# Patient Record
Sex: Female | Born: 1975 | Race: White | Hispanic: No | Marital: Single | Smoking: Never smoker
Health system: Southern US, Community
[De-identification: ages and names within clinical notes are randomized; demographics above are authoritative.]

## PROBLEM LIST (undated history)

## (undated) DIAGNOSIS — J302 Other seasonal allergic rhinitis: Secondary | ICD-10-CM

## (undated) HISTORY — DX: Other seasonal allergic rhinitis: J30.2

---
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
## 2006-07-24 ENCOUNTER — Other Ambulatory Visit: Admission: RE | Admit: 2006-07-24 | Discharge: 2006-07-24 | Payer: Self-pay | Admitting: Gynecology

## 2007-09-16 ENCOUNTER — Other Ambulatory Visit: Admission: RE | Admit: 2007-09-16 | Discharge: 2007-09-16 | Payer: Self-pay | Admitting: Gynecology

## 2007-12-26 ENCOUNTER — Inpatient Hospital Stay: Payer: Self-pay | Admitting: General Surgery

## 2007-12-30 HISTORY — PX: APPENDECTOMY: SHX54

## 2008-10-17 ENCOUNTER — Ambulatory Visit: Payer: Self-pay | Admitting: Women's Health

## 2008-10-17 ENCOUNTER — Other Ambulatory Visit: Admission: RE | Admit: 2008-10-17 | Discharge: 2008-10-17 | Payer: Self-pay | Admitting: Gynecology

## 2008-10-17 ENCOUNTER — Encounter: Payer: Self-pay | Admitting: Women's Health

## 2009-10-20 ENCOUNTER — Ambulatory Visit: Payer: Self-pay | Admitting: Women's Health

## 2009-10-20 ENCOUNTER — Other Ambulatory Visit: Admission: RE | Admit: 2009-10-20 | Discharge: 2009-10-20 | Payer: Self-pay | Admitting: Gynecology

## 2010-11-16 ENCOUNTER — Other Ambulatory Visit: Payer: Self-pay | Admitting: Women's Health

## 2010-11-16 ENCOUNTER — Encounter (INDEPENDENT_AMBULATORY_CARE_PROVIDER_SITE_OTHER): Payer: BC Managed Care – PPO | Admitting: Women's Health

## 2010-11-16 ENCOUNTER — Other Ambulatory Visit (HOSPITAL_COMMUNITY)
Admission: RE | Admit: 2010-11-16 | Discharge: 2010-11-16 | Disposition: A | Discharge status: Home / Self Care | Payer: BC Managed Care – PPO | Source: Ambulatory Visit | Attending: Gynecology | Admitting: Gynecology

## 2010-11-16 DIAGNOSIS — Z124 Encounter for screening for malignant neoplasm of cervix: Secondary | ICD-10-CM | POA: Insufficient documentation

## 2010-11-16 DIAGNOSIS — Z01419 Encounter for gynecological examination (general) (routine) without abnormal findings: Secondary | ICD-10-CM

## 2011-10-29 ENCOUNTER — Other Ambulatory Visit: Payer: Self-pay | Admitting: *Deleted

## 2011-10-29 MED ORDER — NORGESTIMATE-ETH ESTRADIOL 0.25-35 MG-MCG PO TABS: 1.0000 | ORAL_TABLET | Freq: Every day | ORAL | Status: DC

## 2011-11-14 DIAGNOSIS — J302 Other seasonal allergic rhinitis: Secondary | ICD-10-CM | POA: Insufficient documentation

## 2011-11-20 ENCOUNTER — Ambulatory Visit (INDEPENDENT_AMBULATORY_CARE_PROVIDER_SITE_OTHER): Payer: BC Managed Care – PPO | Admitting: Women's Health

## 2011-11-20 ENCOUNTER — Encounter: Payer: Self-pay | Admitting: Women's Health

## 2011-11-20 VITALS — BP 120/86 | Ht 63.5 in | Wt 201.0 lb

## 2011-11-20 DIAGNOSIS — Z833 Family history of diabetes mellitus: Secondary | ICD-10-CM

## 2011-11-20 DIAGNOSIS — N920 Excessive and frequent menstruation with regular cycle: Secondary | ICD-10-CM

## 2011-11-20 DIAGNOSIS — Z01419 Encounter for gynecological examination (general) (routine) without abnormal findings: Secondary | ICD-10-CM

## 2011-11-20 DIAGNOSIS — Z13220 Encounter for screening for lipoid disorders: Secondary | ICD-10-CM

## 2011-11-20 MED ORDER — NORETHIN ACE-ETH ESTRAD-FE 1-20 MG-MCG PO TABS: 1.0000 | ORAL_TABLET | Freq: Every day | ORAL | Status: DC

## 2011-11-20 NOTE — Progress Notes (Signed)
Sandra York 06/27/1976 [id number]    History:    The patient presents for annual exam.  Monthly 4-5 day cycle on Ortho-Cyclen without complaint. States has been under increased stress at work causing increased fatigue. Not sexually active in years. History of all normal Paps.   Past medical history, past surgical history, family history and social history were all reviewed and documented in the EPIC chart. 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   ROS:  A  ROS was performed and pertinent positives and negatives are included in the history.  Exam:  Filed Vitals:   11/20/11 0932  BP: 120/86    General appearance:  Normal Head/Neck:  Normal, without cervical or supraclavicular adenopathy. Thyroid:  Symmetrical, normal in size, without palpable masses or nodularity. Respiratory  Effort:  Normal  Auscultation:  Clear without wheezing or rhonchi Cardiovascular  Auscultation:  Regular rate, without rubs, murmurs or gallops  Edema/varicosities:  Not grossly evident Abdominal  Soft,nontender, without masses, guarding or rebound.  Liver/spleen:  No organomegaly noted  Hernia:  None appreciated  Skin  Inspection:  Grossly normal  Palpation:  Grossly normal Neurologic/psychiatric  Orientation:  Normal with appropriate conversation.  Mood/affect:  Normal  Genitourinary    Breasts: Examined lying and sitting.     Right: Without masses, retractions, discharge or axillary adenopathy.     Left: Without masses, retractions, discharge or axillary adenopathy.   Inguinal/mons:  Normal without inguinal adenopathy  External genitalia:  Normal  BUS/Urethra/Skene's glands:  Normal  Bladder:  Normal  Vagina:  Normal  Cervix:  Normal  Uterus:   normal in size, shape and contour.  Midline and mobile  Adnexa/parametria:     Rt: Without masses or tenderness.   Lt: Without masses or tenderness.  Anus and perineum: Normal  Digital rectal exam: Normal sphincter tone without palpated masses or  tenderness  Assessment/Plan:  36 y.o. SWF G0 for annual exam.    Normal GYN exam on Ortho-Cyclen for history of menorrhagia. Blood pressure today 120/86 history of normal blood pressures Obesity  Plan: Change from Ortho-Cyclen to Loestrin 1/20. Prescription, proper use, slight risk for strokes and clots reviewed. Instructed to check blood pressure always from office if he continues to be greater than 130/80 to call office. Six-month refill given on Loestrin 1/20, return to office for blood pressure check in several months. Also reviewed importance of increasing exercise, decreasing calories for weight loss and health. Condoms encouraged if becomes sexually active. SBE's, calcium rich diet, MVI daily encouraged. CBC, glucose, lipid profile, UA.   Harrington Challenger WHNP, 11:03 AM 11/20/2011

## 2011-11-21 LAB — CBC WITH DIFFERENTIAL/PLATELET
Basophils Absolute: 0.1 10*3/uL (ref 0.0–0.1)
Eosinophils Absolute: 0.2 10*3/uL (ref 0.0–0.7)
Eosinophils Relative: 3 % (ref 0–5)
Lymphocytes Relative: 37 % (ref 12–46)
Lymphs Abs: 2.2 10*3/uL (ref 0.7–4.0)
MCV: 94.6 fL (ref 78.0–100.0)
Neutrophils Relative %: 51 % (ref 43–77)
Platelets: 296 10*3/uL (ref 150–400)
RBC: 4.42 MIL/uL (ref 3.87–5.11)
RDW: 13.5 % (ref 11.5–15.5)
WBC: 5.9 10*3/uL (ref 4.0–10.5)

## 2011-11-21 LAB — URINALYSIS W MICROSCOPIC + REFLEX CULTURE
Bilirubin Urine: NEGATIVE
Crystals: NONE SEEN
Glucose, UA: NEGATIVE mg/dL
Leukocytes, UA: NEGATIVE
Specific Gravity, Urine: 1.021 (ref 1.005–1.030)
Squamous Epithelial / LPF: NONE SEEN
Urobilinogen, UA: 0.2 mg/dL (ref 0.0–1.0)

## 2011-11-21 LAB — LIPID PANEL
Cholesterol: 220 mg/dL — ABNORMAL HIGH (ref 0–200)
HDL: 82 mg/dL (ref >39)

## 2011-11-26 ENCOUNTER — Other Ambulatory Visit: Payer: Self-pay

## 2011-11-26 NOTE — Telephone Encounter (Signed)
[facility] REQUESTING REFILLS ON MONONESSA. [place] CHANGED PT. LOESTRIN. NOTIFIED CASEY TO CANCEL MONONESSA RX IN SYSTEM.

## 2012-05-14 ENCOUNTER — Ambulatory Visit (INDEPENDENT_AMBULATORY_CARE_PROVIDER_SITE_OTHER): Payer: BC Managed Care – PPO | Admitting: Women's Health

## 2012-05-14 ENCOUNTER — Encounter: Payer: Self-pay | Admitting: Women's Health

## 2012-05-14 VITALS — BP 126/80

## 2012-05-14 DIAGNOSIS — N920 Excessive and frequent menstruation with regular cycle: Secondary | ICD-10-CM

## 2012-05-14 MED ORDER — NORETHIN ACE-ETH ESTRAD-FE 1-20 MG-MCG PO TABS: 1.0000 | ORAL_TABLET | Freq: Every day | ORAL | Status: DC

## 2012-05-14 NOTE — Progress Notes (Signed)
Patient ID: Sandra York, female   DOB: 01-17-1976, 36 y.o.   MRN: [id number] Presents for blood pressure followup, states has taken her blood pressure monthly at home at different times and blood pressures remained less than 130/80. Blood pressure at annual exam was elevated 120/86, currently on Loestrin 120. Denies headache or visual changes.  Exam: Blood pressure 126/80 today.   Oral contraception pill with occasional blood pressure elevations.  Plan: Continue to monitor blood pressure, call or return to office if blood pressure greater than 130/80. Aware of hazards of hypertension. Prescription for Loestrin 1/20 with 6 refills until next annual exam given with precautions of slight risk for blood clots and strokes. Other contraception options were reviewed, declines would prefer to continue with the pill.

## 2012-06-09 ENCOUNTER — Other Ambulatory Visit: Payer: Self-pay | Admitting: *Deleted

## 2012-06-09 DIAGNOSIS — N920 Excessive and frequent menstruation with regular cycle: Secondary | ICD-10-CM

## 2012-06-09 MED ORDER — NORETHIN ACE-ETH ESTRAD-FE 1-20 MG-MCG PO TABS: 1.0000 | ORAL_TABLET | Freq: Every day | ORAL | Status: DC

## 2012-11-23 ENCOUNTER — Encounter: Payer: Self-pay | Admitting: Women's Health

## 2012-11-23 ENCOUNTER — Ambulatory Visit (INDEPENDENT_AMBULATORY_CARE_PROVIDER_SITE_OTHER): Payer: BC Managed Care – PPO | Admitting: Women's Health

## 2012-11-23 VITALS — BP 118/68 | Ht 63.5 in | Wt 196.5 lb

## 2012-11-23 DIAGNOSIS — Z833 Family history of diabetes mellitus: Secondary | ICD-10-CM

## 2012-11-23 DIAGNOSIS — N920 Excessive and frequent menstruation with regular cycle: Secondary | ICD-10-CM

## 2012-11-23 DIAGNOSIS — Z01419 Encounter for gynecological examination (general) (routine) without abnormal findings: Secondary | ICD-10-CM

## 2012-11-23 DIAGNOSIS — Z13220 Encounter for screening for lipoid disorders: Secondary | ICD-10-CM

## 2012-11-23 DIAGNOSIS — E079 Disorder of thyroid, unspecified: Secondary | ICD-10-CM

## 2012-11-23 DIAGNOSIS — G4700 Insomnia, unspecified: Secondary | ICD-10-CM

## 2012-11-23 MED ORDER — ZOLPIDEM TARTRATE 10 MG PO TABS: 10.0000 mg | ORAL_TABLET | Freq: Every evening | ORAL | Status: DC | PRN

## 2012-11-23 MED ORDER — NORETHIN ACE-ETH ESTRAD-FE 1-20 MG-MCG PO TABS: 1.0000 | ORAL_TABLET | Freq: Every day | ORAL | Status: DC

## 2012-11-23 NOTE — Progress Notes (Signed)
Sandra York [birth year] [id number]    History:    The patient presents for annual exam.  Light monthly cycle on Loestrin 1/20. Not sexually active years. History of normal Paps. Reports some increased moodiness and insomnia in the past few weeks questions if it is weather related. Difficulty sleeping for years, wakes up numerous times per night.  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   ROS:  A  ROS was performed and pertinent positives and negatives are included in the history.  Exam:  Filed Vitals:   11/23/12 0933  BP: 118/68    General appearance:  Normal Head/Neck:  Normal, without cervical or supraclavicular adenopathy. Thyroid:  Symmetrical, normal in size, without palpable masses or nodularity. Respiratory  Effort:  Normal  Auscultation:  Clear without wheezing or rhonchi Cardiovascular  Auscultation:  Regular rate, without rubs, murmurs or gallops  Edema/varicosities:  Not grossly evident Abdominal  Soft,nontender, without masses, guarding or rebound.  Liver/spleen:  No organomegaly noted  Hernia:  None appreciated  Skin  Inspection:  Grossly normal  Palpation:  Grossly normal Neurologic/psychiatric  Orientation:  Normal with appropriate conversation.  Mood/affect:  Normal  Genitourinary    Breasts: Examined lying and sitting.     Right: Without masses, retractions, discharge or axillary adenopathy.     Left: Without masses, retractions, discharge or axillary adenopathy.   Inguinal/mons:  Normal without inguinal adenopathy  External genitalia:  Normal  BUS/Urethra/Skene's glands:  Normal  Bladder:  Normal  Vagina:  Normal  Cervix:  Normal  Uterus:   normal in size, shape and contour.  Midline and mobile  Adnexa/parametria:     Rt: Without masses or tenderness.   Lt: Without masses or tenderness.  Anus and  perineum: Normal  Digital rectal exam: Normal sphincter tone without palpated masses or tenderness  Assessment/Plan:  37 y.o. S. WF G0 for annual exam.     Normal GYN exam on Loestrin 1/20 Insomnia  Plan: Denies need for counseling. Reviewed importance of leisure activities, regular exercise to help with stress and rest. Sleep hygiene reviewed. Ambien 10 mg by mouth at bedtime as needed for rest, prescription, proper use given and reviewed. SBE's, regular exercise, calcium rich diet, MVI daily encouraged. CBC, Glucose, LP, TSH, UA, Pap normal 2013, new screening guidelines reviewed.    Harrington Challenger WHNP, 10:07 AM 11/23/2012

## 2012-11-23 NOTE — Patient Instructions (Addendum)

## 2012-11-24 LAB — CBC WITH DIFFERENTIAL/PLATELET
Basophils Relative: 1 % (ref 0–1)
Eosinophils Absolute: 0.1 10*3/uL (ref 0.0–0.7)
Eosinophils Relative: 2 % (ref 0–5)
Hemoglobin: 14.8 g/dL (ref 12.0–15.0)
Lymphs Abs: 2.3 10*3/uL (ref 0.7–4.0)
MCH: 31.6 pg (ref 26.0–34.0)
MCHC: 33.9 g/dL (ref 30.0–36.0)
MCV: 93.2 fL (ref 78.0–100.0)
Monocytes Relative: 6 % (ref 3–12)
Neutrophils Relative %: 58 % (ref 43–77)

## 2012-11-24 LAB — LIPID PANEL
LDL Cholesterol: 85 mg/dL (ref 0–99)
Triglycerides: 115 mg/dL (ref <150)
VLDL: 23 mg/dL (ref 0–40)

## 2012-11-24 LAB — URINALYSIS W MICROSCOPIC + REFLEX CULTURE
Bacteria, UA: NONE SEEN
Casts: NONE SEEN
Glucose, UA: NEGATIVE mg/dL
Hgb urine dipstick: NEGATIVE
Nitrite: NEGATIVE
Specific Gravity, Urine: >1.03 — ABNORMAL HIGH (ref 1.005–1.030)
pH: 5.5 (ref 5.0–8.0)

## 2012-11-24 LAB — TSH: TSH: 0.89 u[IU]/mL (ref 0.350–4.500)

## 2012-11-24 LAB — GLUCOSE, RANDOM: Glucose, Bld: 94 mg/dL (ref 70–99)

## 2012-12-30 ENCOUNTER — Telehealth: Payer: Self-pay | Admitting: *Deleted

## 2012-12-30 ENCOUNTER — Other Ambulatory Visit: Payer: Self-pay | Admitting: Women's Health

## 2012-12-30 DIAGNOSIS — N946 Dysmenorrhea, unspecified: Secondary | ICD-10-CM

## 2012-12-30 MED ORDER — NORGESTIMATE-ETH ESTRADIOL 0.25-35 MG-MCG PO TABS: 1.0000 | ORAL_TABLET | Freq: Every day | ORAL | Status: DC

## 2012-12-30 NOTE — Telephone Encounter (Signed)
Pt is currently taking Loestrin 1/20 has been for about 64yr and half now. Pt said that she has been bleeding for about 3 weeks now and having cramping. Pt said that she noticed a difference back in dec 2013 with her body and the pills. She takes them daily on time. Please advise

## 2012-12-30 NOTE — Telephone Encounter (Signed)
Telephone call to review problem, states has had increased cramping, cycles starting third week of pills not lasting greater than 7 days. Had been on Ortho-Cyclen prior. Is a nonsmoker. Will use change back to Ortho-Cyclen, call if continued problems. Condoms if become sexually active, nonactive in years.

## 2013-08-10 ENCOUNTER — Encounter: Payer: Self-pay | Admitting: Women's Health

## 2013-09-09 ENCOUNTER — Telehealth: Payer: Self-pay | Admitting: *Deleted

## 2013-09-09 NOTE — Telephone Encounter (Signed)
Pt asked if you would call her regarding her cycle and update from telephone encounter 12/30/12. Call back # [phone]

## 2013-09-09 NOTE — Telephone Encounter (Signed)
Telephone call, states is doing well on Loestrin but states cycle is very light flow with thicker consistency. Reviewed normal, we'll continue to watch

## 2013-10-13 ENCOUNTER — Other Ambulatory Visit: Payer: Self-pay

## 2013-10-13 ENCOUNTER — Other Ambulatory Visit: Payer: Self-pay | Admitting: Women's Health

## 2013-10-13 DIAGNOSIS — N946 Dysmenorrhea, unspecified: Secondary | ICD-10-CM

## 2013-10-13 MED ORDER — NORGESTIMATE-ETH ESTRADIOL 0.25-35 MG-MCG PO TABS: 1.0000 | ORAL_TABLET | Freq: Every day | ORAL | Status: DC

## 2013-11-30 ENCOUNTER — Encounter: Payer: Self-pay | Admitting: Women's Health

## 2013-12-08 ENCOUNTER — Ambulatory Visit (INDEPENDENT_AMBULATORY_CARE_PROVIDER_SITE_OTHER): Payer: BC Managed Care – PPO | Admitting: Women's Health

## 2013-12-08 ENCOUNTER — Other Ambulatory Visit (HOSPITAL_COMMUNITY)
Admission: RE | Admit: 2013-12-08 | Discharge: 2013-12-08 | Disposition: A | Discharge status: Home / Self Care | Payer: BC Managed Care – PPO | Source: Ambulatory Visit | Attending: Gynecology | Admitting: Gynecology

## 2013-12-08 ENCOUNTER — Encounter: Payer: Self-pay | Admitting: Women's Health

## 2013-12-08 VITALS — BP 116/70 | Ht 63.75 in | Wt 183.8 lb

## 2013-12-08 DIAGNOSIS — Z833 Family history of diabetes mellitus: Secondary | ICD-10-CM

## 2013-12-08 DIAGNOSIS — N946 Dysmenorrhea, unspecified: Secondary | ICD-10-CM

## 2013-12-08 DIAGNOSIS — Z13220 Encounter for screening for lipoid disorders: Secondary | ICD-10-CM

## 2013-12-08 DIAGNOSIS — E079 Disorder of thyroid, unspecified: Secondary | ICD-10-CM

## 2013-12-08 DIAGNOSIS — Z01419 Encounter for gynecological examination (general) (routine) without abnormal findings: Secondary | ICD-10-CM

## 2013-12-08 LAB — LIPID PANEL
Cholesterol: 236 mg/dL — ABNORMAL HIGH (ref 0–200)
HDL: 85 mg/dL (ref >39)
LDL CALC: 133 mg/dL — AB (ref 0–99)
Total CHOL/HDL Ratio: 2.8 Ratio
Triglycerides: 89 mg/dL (ref <150)
VLDL: 18 mg/dL (ref 0–40)

## 2013-12-08 LAB — CBC WITH DIFFERENTIAL/PLATELET
BASOS ABS: 0.1 10*3/uL (ref 0.0–0.1)
Basophils Relative: 2 % — ABNORMAL HIGH (ref 0–1)
EOS ABS: 0.2 10*3/uL (ref 0.0–0.7)
EOS PCT: 4 % (ref 0–5)
HCT: 39.4 % (ref 36.0–46.0)
Hemoglobin: 13.2 g/dL (ref 12.0–15.0)
Lymphocytes Relative: 36 % (ref 12–46)
Lymphs Abs: 1.9 10*3/uL (ref 0.7–4.0)
MCH: 31 pg (ref 26.0–34.0)
MCHC: 33.5 g/dL (ref 30.0–36.0)
MCV: 92.5 fL (ref 78.0–100.0)
Monocytes Absolute: 0.3 10*3/uL (ref 0.1–1.0)
Monocytes Relative: 5 % (ref 3–12)
Neutro Abs: 2.8 10*3/uL (ref 1.7–7.7)
Neutrophils Relative %: 53 % (ref 43–77)
PLATELETS: 307 10*3/uL (ref 150–400)
RBC: 4.26 MIL/uL (ref 3.87–5.11)
RDW: 13.8 % (ref 11.5–15.5)
WBC: 5.2 10*3/uL (ref 4.0–10.5)

## 2013-12-08 LAB — GLUCOSE, RANDOM: Glucose, Bld: 104 mg/dL — ABNORMAL HIGH (ref 70–99)

## 2013-12-08 LAB — TSH: TSH: 0.79 u[IU]/mL (ref 0.350–4.500)

## 2013-12-08 MED ORDER — NORGESTIMATE-ETH ESTRADIOL 0.25-35 MG-MCG PO TABS: 1.0000 | ORAL_TABLET | Freq: Every day | ORAL | Status: DC

## 2013-12-08 NOTE — Patient Instructions (Signed)

## 2013-12-08 NOTE — Addendum Note (Signed)
Addended by: Richardson ChiquitoWILKINSON, KARI S on: 12/08/2013 10:36 AM   Modules accepted: Orders

## 2013-12-08 NOTE — Progress Notes (Signed)
Sandra [name] 05/20/1976 [id number]    History:    Presents for annual exam. Monthly cycle on Ortho-Cyclen. Not sexually active years. Had spotting and increased cramps on Loestrin. Normal Pap history. Has lost 16 pounds in the past year with diet and exercise.  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 Father hypertension.  ROS:  A  ROS was performed and pertinent positives and negatives are included.  Exam:  Filed Vitals:   12/08/13 0935  BP: 116/70    General appearance:  Normal Thyroid:  Symmetrical, normal in size, without palpable masses or nodularity. Respiratory  Auscultation:  Clear without wheezing or rhonchi Cardiovascular  Auscultation:  Regular rate, without rubs, murmurs or gallops  Edema/varicosities:  Not grossly evident Abdominal  Soft,nontender, without masses, guarding or rebound.  Liver/spleen:  No organomegaly noted  Hernia:  None appreciated  Skin  Inspection:  Grossly normal   Breasts: Examined lying and sitting.     Right: Without masses, retractions, discharge or axillary adenopathy.     Left: Without masses, retractions, discharge or axillary adenopathy. Gentitourinary   Inguinal/mons:  Normal without inguinal adenopathy  External genitalia:  Normal  BUS/Urethra/Skene's glands:  Normal  Vagina:  Normal  Cervix:  Normal  Uterus:   normal in size, shape and contour.  Midline and mobile  Adnexa/parametria:     Rt: Without masses or tenderness.   Lt: Without masses or tenderness.  Anus and perineum: Normal  Digital rectal exam: Normal sphincter tone without palpated masses or tenderness  Assessment/Plan:  38 y.o. SWF G0 for annual exam with no complaints.  Normal GYN exam on OCs for cycle control  Plan: Ortho-Cyclen prescription, proper use, reviewed risks of blood clots and strokes. Condoms  encouraged if become sexually active. SBE's, screening mammogram an annual screen age 38. Continue regular exercise/walking daily, decreasing calories for continued weight loss, calcium rich foods, MVI daily. CBC, glucose, lipid panel, TSH, UA, Pap. Pap Normal 10/2010, new screening guidelines reviewed.    Harrington ChallengerYOUNG,NANCY J WHNP, 10:01 AM 12/08/2013

## 2013-12-09 ENCOUNTER — Encounter: Payer: Self-pay | Admitting: Women's Health

## 2013-12-09 LAB — URINALYSIS W MICROSCOPIC + REFLEX CULTURE
Bilirubin Urine: NEGATIVE
CASTS: NONE SEEN
CRYSTALS: NONE SEEN
Glucose, UA: NEGATIVE mg/dL
HGB URINE DIPSTICK: NEGATIVE
Ketones, ur: NEGATIVE mg/dL
LEUKOCYTES UA: NEGATIVE
NITRITE: NEGATIVE
PH: 7 (ref 5.0–8.0)
Protein, ur: NEGATIVE mg/dL
SPECIFIC GRAVITY, URINE: 1.011 (ref 1.005–1.030)
Squamous Epithelial / LPF: NONE SEEN
UROBILINOGEN UA: 0.2 mg/dL (ref 0.0–1.0)

## 2013-12-10 ENCOUNTER — Other Ambulatory Visit: Payer: Self-pay | Admitting: Women's Health

## 2013-12-10 MED ORDER — CIPROFLOXACIN HCL 250 MG PO TABS: 250.0000 mg | ORAL_TABLET | Freq: Two times a day (BID) | ORAL | Status: DC

## 2013-12-11 LAB — URINE CULTURE: Colony Count: >=100000

## 2014-07-15 ENCOUNTER — Other Ambulatory Visit: Payer: Self-pay

## 2014-12-21 ENCOUNTER — Ambulatory Visit (INDEPENDENT_AMBULATORY_CARE_PROVIDER_SITE_OTHER): Payer: BC Managed Care – PPO | Admitting: Women's Health

## 2014-12-21 ENCOUNTER — Encounter: Payer: Self-pay | Admitting: Women's Health

## 2014-12-21 VITALS — BP 112/72 | Ht 63.75 in | Wt 186.0 lb

## 2014-12-21 DIAGNOSIS — F515 Nightmare disorder: Secondary | ICD-10-CM | POA: Diagnosis not present

## 2014-12-21 DIAGNOSIS — Z01419 Encounter for gynecological examination (general) (routine) without abnormal findings: Secondary | ICD-10-CM | POA: Diagnosis not present

## 2014-12-21 DIAGNOSIS — Z13220 Encounter for screening for lipoid disorders: Secondary | ICD-10-CM | POA: Diagnosis not present

## 2014-12-21 DIAGNOSIS — Z3041 Encounter for surveillance of contraceptive pills: Secondary | ICD-10-CM

## 2014-12-21 DIAGNOSIS — Z833 Family history of diabetes mellitus: Secondary | ICD-10-CM

## 2014-12-21 DIAGNOSIS — Z113 Encounter for screening for infections with a predominantly sexual mode of transmission: Secondary | ICD-10-CM

## 2014-12-21 DIAGNOSIS — K640 First degree hemorrhoids: Secondary | ICD-10-CM

## 2014-12-21 LAB — CBC WITH DIFFERENTIAL/PLATELET
Basophils Absolute: 0.1 10*3/uL (ref 0.0–0.1)
Basophils Relative: 2 % — ABNORMAL HIGH (ref 0–1)
EOS PCT: 3 % (ref 0–5)
Eosinophils Absolute: 0.2 10*3/uL (ref 0.0–0.7)
HEMATOCRIT: 39.1 % (ref 36.0–46.0)
HEMOGLOBIN: 12.8 g/dL (ref 12.0–15.0)
LYMPHS ABS: 1.6 10*3/uL (ref 0.7–4.0)
LYMPHS PCT: 27 % (ref 12–46)
MCH: 30.3 pg (ref 26.0–34.0)
MCHC: 32.7 g/dL (ref 30.0–36.0)
MCV: 92.7 fL (ref 78.0–100.0)
MPV: 9.4 fL (ref 8.6–12.4)
Monocytes Absolute: 0.5 10*3/uL (ref 0.1–1.0)
Monocytes Relative: 9 % (ref 3–12)
Neutro Abs: 3.5 10*3/uL (ref 1.7–7.7)
Neutrophils Relative %: 59 % (ref 43–77)
Platelets: 299 10*3/uL (ref 150–400)
RBC: 4.22 MIL/uL (ref 3.87–5.11)
RDW: 13.3 % (ref 11.5–15.5)
WBC: 6 10*3/uL (ref 4.0–10.5)

## 2014-12-21 LAB — LIPID PANEL
CHOL/HDL RATIO: 2.1 ratio
Cholesterol: 205 mg/dL — ABNORMAL HIGH (ref 0–200)
HDL: 96 mg/dL (ref >=46)
LDL Cholesterol: 81 mg/dL (ref 0–99)
TRIGLYCERIDES: 138 mg/dL (ref <150)
VLDL: 28 mg/dL (ref 0–40)

## 2014-12-21 LAB — GLUCOSE, RANDOM: Glucose, Bld: 102 mg/dL — ABNORMAL HIGH (ref 70–99)

## 2014-12-21 MED ORDER — NORETHIN ACE-ETH ESTRAD-FE 1-20 MG-MCG PO TABS: 1.0000 | ORAL_TABLET | Freq: Every day | ORAL | Status: DC

## 2014-12-21 MED ORDER — HYDROCORTISONE ACE-PRAMOXINE 2.5-1 % RE CREA: 1.0000 "application " | TOPICAL_CREAM | Freq: Three times a day (TID) | RECTAL | Status: DC

## 2014-12-21 NOTE — Patient Instructions (Signed)
About Hemorrhoids  Hemorrhoids are swollen veins in the lower rectum and anus.  Also called piles, hemorrhoids are a common problem.  Hemorrhoids may be internal (inside the rectum) or external (around the anus).  Internal Hemorrhoids  Internal hemorrhoids are often painless, but they rarely cause bleeding.  The internal veins may stretch and fall down (prolapse) through the anus to the outside of the body.  The veins may then become irritated and painful.  External Hemorrhoids  External hemorrhoids can be easily seen or felt around the anal opening.  They are under the skin around the anus.  When the swollen veins are scratched or broken by straining, rubbing or wiping they sometimes bleed.  How Hemorrhoids Occur  Veins in the rectum and around the anus tend to swell under pressure.  Hemorrhoids can result from increased pressure in the veins of your anus or rectum.  Some sources of pressure are:   Straining to have a bowel movement because of constipation  Waiting too long to have a bowel movement  Coughing and sneezing often  Sitting for extended periods of time, including on the toilet  Diarrhea  Obesity  Trauma or injury to the anus  Some liver diseases  Stress  Family history of hemorrhoids  Pregnancy  Pregnant women should try to avoid becoming constipated, because they are more likely to have hemorrhoids during pregnancy.  In the last trimester of pregnancy, the enlarged uterus may press on blood vessels and causes hemorrhoids.  In addition, the strain of childbirth sometimes causes hemorrhoids after the birth.  Symptoms of Hemorrhoids  Some symptoms of hemorrhoids include:  Swelling and/or a tender lump around the anus  Itching, mild burning and bleeding around the anus  Painful bowel movements with or without constipation  Bright red blood covering the stool, on toilet paper or in the toilet bowel.   Symptoms usually go away within a few days.  Always  talk to your doctor about any bleeding to make sure it is not from some other causes.  Diagnosing and Treating Hemorrhoids  Diagnosis is made by an examination by your healthcare provider.  Special test can be performed by your doctor.    Most cases of hemorrhoids can be treated with:  High-fiber diet: Eat more high-fiber foods, which help prevent constipation.  Ask for more detailed fiber information on types and sources of fiber from your healthcare provider.  Fluids: Drink plenty of water.  This helps soften bowel movements so they are easier to pass.  Sitz baths and cold packs: Sitting in lukewarm water two or three times a day for 15 minutes cleases the anal area and may relieve discomfort.  If the water is too hot, swelling around the anus will get worse.  Placing a cloth-covered ice pack on the anus for ten minutes four times a day can also help reduce selling.  Gently pushing a prolapsed hemorrhoid back inside after the bath or ice pack can be helpful.  Medications: For mild discomfort, your healthcare provider may suggest over-the-counter pain medication or prescribe a cream or ointment for topical use.  The cream may contain witch hazel, zinc oxide or petroleum jelly.  Medicated suppositories are also a treatment option.  Always consult your doctor before applying medications or creams.  Procedures and surgeries: There are also a number of procedures and surgeries to shrink or remove hemorrhoids in more serious cases.  Talk to your physician about these options.  You can often prevent hemorrhoids or keep  them from becoming worse by maintaining a healthy lifestyle.  Eat a fiber-rich diet of fruits, vegetables and whole grains.  Also, drink plenty of water and exercise regularly.   2007, [employer] Doc.30Insomnia Insomnia is frequent trouble falling and/or staying asleep. Insomnia can be a long term problem or a short term problem. Both are common. Insomnia can be a short  term problem when the wakefulness is related to a certain stress or worry. Long term insomnia is often related to ongoing stress during waking hours and/or poor sleeping habits. Overtime, sleep deprivation itself can make the problem worse. Every little thing feels more severe because you are overtired and your ability to cope is decreased. CAUSES  12. Stress, anxiety, and depression. 13. Poor sleeping habits. 14. Distractions such as TV in the bedroom. 15. Naps close to bedtime. 74. Engaging in emotionally charged conversations before bed. 17. Technical reading before sleep. 18. Alcohol and other sedatives. They may make the problem worse. They can hurt normal sleep patterns and normal dream activity. 19. Stimulants such as caffeine for several hours prior to bedtime. 20. Pain syndromes and shortness of breath can cause insomnia. 21. Exercise late at night. 22. Changing time zones may cause sleeping problems (jet lag). It is sometimes helpful to have someone observe your sleeping patterns. They should look for periods of not breathing during the night (sleep apnea). They should also look to see how long those periods last. If you live alone or observers are uncertain, you can also be observed at a sleep clinic where your sleep patterns will be professionally monitored. Sleep apnea requires a checkup and treatment. Give your caregivers your medical history. Give your caregivers observations your family has made about your sleep.  SYMPTOMS  5. Not feeling rested in the morning. 6. Anxiety and restlessness at bedtime. 7. Difficulty falling and staying asleep. TREATMENT  6. Your caregiver may prescribe treatment for an underlying medical disorders. Your caregiver can give advice or help if you are using alcohol or other drugs for self-medication. Treatment of underlying problems will usually eliminate insomnia problems. 7. Medications can be prescribed for short time use. They are generally not  recommended for lengthy use. 8. Over-the-counter sleep medicines are not recommended for lengthy use. They can be habit forming. 9. You can promote easier sleeping by making lifestyle changes such as: 1. Using relaxation techniques that help with breathing and reduce muscle tension. 2. Exercising earlier in the day. 3. Changing your diet and the time of your last meal. No night time snacks. 4. Establish a regular time to go to bed. 10. Counseling can help with stressful problems and worry. 11. Soothing music and white noise may be helpful if there are background noises you cannot remove. 12. Stop tedious detailed work at least one hour before bedtime. HOME CARE INSTRUCTIONS   Keep a diary. Inform your caregiver about your progress. This includes any medication side effects. See your caregiver regularly. Take note of:  Times when you are asleep.  Times when you are awake during the night.  The quality of your sleep.  How you feel the next day. This information will help your caregiver care for you.  Get out of bed if you are still awake after 15 minutes. Read or do some quiet activity. Keep the lights down. Wait until you feel sleepy and go back to bed.  Keep regular sleeping and waking hours. Avoid naps.  Exercise regularly.  Avoid distractions at bedtime. Distractions include watching television  or engaging in any intense or detailed activity like attempting to balance the household checkbook.  Develop a bedtime ritual. Keep a familiar routine of bathing, brushing your teeth, climbing into bed at the same time each night, listening to soothing music. Routines increase the success of falling to sleep faster.  Use relaxation techniques. This can be using breathing and muscle tension release routines. It can also include visualizing peaceful scenes. You can also help control troubling or intruding thoughts by keeping your mind occupied with boring or repetitive thoughts like the old  concept of counting sheep. You can make it more creative like imagining planting one beautiful flower after another in your backyard garden.  During your day, work to eliminate stress. When this is not possible use some of the previous suggestions to help reduce the anxiety that accompanies stressful situations. MAKE SURE YOU:   Understand these instructions.  Will watch your condition.  Will get help right away if you are not doing well or get worse. Document Released: 09/13/2000 Document Revised: 12/09/2011 Document Reviewed: 10/14/2007 [facility] Patient Information 2015 [place], [place]. This information is not intended to replace advice given to you by your health care provider. Make sure you discuss any questions you have with your health care provider. Health Maintenance Adopting a healthy lifestyle and getting preventive care can go a long way to promote health and wellness. Talk with your health care provider about what schedule of regular examinations is right for you. This is a good chance for you to check in with your provider about disease prevention and staying healthy. In between checkups, there are plenty of things you can do on your own. Experts have done a lot of research about which lifestyle changes and preventive measures are most likely to keep you healthy. Ask your health care provider for more information. WEIGHT AND DIET  Eat a healthy diet 23. Be sure to include plenty of vegetables, fruits, low-fat dairy products, and lean protein. 24. Do not eat a lot of foods high in solid fats, added sugars, or salt. 25. Get regular exercise. This is one of the most important things you can do for your health. 1. Most adults should exercise for at least 150 minutes each week. The exercise should increase your heart rate and make you sweat (moderate-intensity exercise). 2. Most adults should also do strengthening exercises at least twice a week. This is in addition to the  moderate-intensity exercise.  Maintain a healthy weight 8. Body mass index (BMI) is a measurement that can be used to identify possible weight problems. It estimates body fat based on height and weight. Your health care provider can help determine your BMI and help you achieve or maintain a healthy weight. 2. For females 59 years of age and older:  1. A BMI below 18.5 is considered underweight. 2. A BMI of 18.5 to 24.9 is normal. 3. A BMI of 25 to 29.9 is considered overweight. 4. A BMI of 30 and above is considered obese.  Watch levels of cholesterol and blood lipids 13. You should start having your blood tested for lipids and cholesterol at 41 years of age, then have this test every 5 years. 14. You may need to have your cholesterol levels checked more often if: 1. Your lipid or cholesterol levels are high. 2. You are older than 41 years of age. 3. You are at high risk for heart disease.  CANCER SCREENING   Lung Cancer  Lung cancer screening is recommended for  adults 32-30 years old who are at high risk for lung cancer because of a history of smoking.  A yearly low-dose CT scan of the lungs is recommended for people who:  Currently smoke.  Have quit within the past 15 years.  Have at least a 30-pack-year history of smoking. A pack year is smoking an average of one pack of cigarettes a day for 1 year.  Yearly screening should continue until it has been 15 years since you quit.  Yearly screening should stop if you develop a health problem that would prevent you from having lung cancer treatment.  Breast Cancer  Practice breast self-awareness. This means understanding how your breasts normally appear and feel.  It also means doing regular breast self-exams. Let your health care provider know about any changes, no matter how small.  If you are in your 20s or 30s, you should have a clinical breast exam (CBE) by a health care provider every 1-3 years as part of a regular health  exam.  If you are 47 or older, have a CBE every year. Also consider having a breast X-ray (mammogram) every year.  If you have a family history of breast cancer, talk to your health care provider about genetic screening.  If you are at high risk for breast cancer, talk to your health care provider about having an MRI and a mammogram every year.  Breast cancer gene (BRCA) assessment is recommended for women who have family members with BRCA-related cancers. BRCA-related cancers include:  Breast.  Ovarian.  Tubal.  Peritoneal cancers.  Results of the assessment will determine the need for genetic counseling and BRCA1 and BRCA2 testing. Cervical Cancer Routine pelvic examinations to screen for cervical cancer are no longer recommended for nonpregnant women who are considered low risk for cancer of the pelvic organs (ovaries, uterus, and vagina) and who do not have symptoms. A pelvic examination may be necessary if you have symptoms including those associated with pelvic infections. Ask your health care provider if a screening pelvic exam is right for you.   The Pap test is the screening test for cervical cancer for women who are considered at risk.  If you had a hysterectomy for a problem that was not cancer or a condition that could lead to cancer, then you no longer need Pap tests.  If you are older than 65 years, and you have had normal Pap tests for the past 10 years, you no longer need to have Pap tests.  If you have had past treatment for cervical cancer or a condition that could lead to cancer, you need Pap tests and screening for cancer for at least 20 years after your treatment.  If you no longer get a Pap test, assess your risk factors if they change (such as having a new sexual partner). This can affect whether you should start being screened again.  Some women have medical problems that increase their chance of getting cervical cancer. If this is the case for you, your health  care provider may recommend more frequent screening and Pap tests.  The human papillomavirus (HPV) test is another test that may be used for cervical cancer screening. The HPV test looks for the virus that can cause cell changes in the cervix. The cells collected during the Pap test can be tested for HPV.  The HPV test can be used to screen women 43 years of age and older. Getting tested for HPV can extend the interval between normal Pap  tests from three to five years.  An HPV test also should be used to screen women of any age who have unclear Pap test results.  After 41 years of age, women should have HPV testing as often as Pap tests.  Colorectal Cancer  This type of cancer can be detected and often prevented.  Routine colorectal cancer screening usually begins at 41 years of age and continues through 40 years of age.  Your health care provider may recommend screening at an earlier age if you have risk factors for colon cancer.  Your health care provider may also recommend using home test kits to check for hidden blood in the stool.  A small camera at the end of a tube can be used to examine your colon directly (sigmoidoscopy or colonoscopy). This is done to check for the earliest forms of colorectal cancer.  Routine screening usually begins at age 86.  Direct examination of the colon should be repeated every 5-10 years through 41 years of age. However, you may need to be screened more often if early forms of precancerous polyps or small growths are found. Skin Cancer  Check your skin from head to toe regularly.  Tell your health care provider about any new moles or changes in moles, especially if there is a change in a mole's shape or color.  Also tell your health care provider if you have a mole that is larger than the size of a pencil eraser.  Always use sunscreen. Apply sunscreen liberally and repeatedly throughout the day.  Protect yourself by wearing long sleeves, pants, a  wide-brimmed hat, and sunglasses whenever you are outside. HEART DISEASE, DIABETES, AND HIGH BLOOD PRESSURE   Have your blood pressure checked at least every 1-2 years. High blood pressure causes heart disease and increases the risk of stroke.  If you are between 66 years and 54 years old, ask your health care provider if you should take aspirin to prevent strokes.  Have regular diabetes screenings. This involves taking a blood sample to check your fasting blood sugar level.  If you are at a normal weight and have a low risk for diabetes, have this test once every three years after 41 years of age.  If you are overweight and have a high risk for diabetes, consider being tested at a younger age or more often. PREVENTING INFECTION  Hepatitis B  If you have a higher risk for hepatitis B, you should be screened for this virus. You are considered at high risk for hepatitis B if:  You were born in a country where hepatitis B is common. Ask your health care provider which countries are considered high risk.  Your parents were born in a high-risk country, and you have not been immunized against hepatitis B (hepatitis B vaccine).  You have HIV or AIDS.  You use needles to inject street drugs.  You live with someone who has hepatitis B.  You have had sex with someone who has hepatitis B.  You get hemodialysis treatment.  You take certain medicines for conditions, including cancer, organ transplantation, and autoimmune conditions. Hepatitis C  Blood testing is recommended for:  Everyone born from [birth year] through 1967.  Anyone with known risk factors for hepatitis C. Sexually transmitted infections (STIs)  You should be screened for sexually transmitted infections (STIs) including gonorrhea and chlamydia if:  You are sexually active and are younger than 41 years of age.  You are older than 41 years of age and your  health care provider tells you that you are at risk for this type of  infection.  Your sexual activity has changed since you were last screened and you are at an increased risk for chlamydia or gonorrhea. Ask your health care provider if you are at risk.  If you do not have HIV, but are at risk, it may be recommended that you take a prescription medicine daily to prevent HIV infection. This is called pre-exposure prophylaxis (PrEP). You are considered at risk if:  You are sexually active and do not regularly use condoms or know the HIV status of your partner(s).  You take drugs by injection.  You are sexually active with a partner who has HIV. Talk with your health care provider about whether you are at high risk of being infected with HIV. If you choose to begin PrEP, you should first be tested for HIV. You should then be tested every 3 months for as long as you are taking PrEP.  PREGNANCY   If you are premenopausal and you may become pregnant, ask your health care provider about preconception counseling.  If you may become pregnant, take 400 to 800 micrograms (mcg) of folic acid every day.  If you want to prevent pregnancy, talk to your health care provider about birth control (contraception). OSTEOPOROSIS AND MENOPAUSE   Osteoporosis is a disease in which the bones lose minerals and strength with aging. This can result in serious bone fractures. Your risk for osteoporosis can be identified using a bone density scan.  If you are 40 years of age or older, or if you are at risk for osteoporosis and fractures, ask your health care provider if you should be screened.  Ask your health care provider whether you should take a calcium or vitamin D supplement to lower your risk for osteoporosis.  Menopause may have certain physical symptoms and risks.  Hormone replacement therapy may reduce some of these symptoms and risks. Talk to your health care provider about whether hormone replacement therapy is right for you.  HOME CARE INSTRUCTIONS   Schedule regular  health, dental, and eye exams.  Stay current with your immunizations.   Do not use any tobacco products including cigarettes, chewing tobacco, or electronic cigarettes.  If you are pregnant, do not drink alcohol.  If you are breastfeeding, limit how much and how often you drink alcohol.  Limit alcohol intake to no more than 1 drink per day for nonpregnant women. One drink equals 12 ounces of beer, 5 ounces of wine, or 1 ounces of hard liquor.  Do not use street drugs.  Do not share needles.  Ask your health care provider for help if you need support or information about quitting drugs.  Tell your health care provider if you often feel depressed.  Tell your health care provider if you have ever been abused or do not feel safe at home. Document Released: 04/01/2011 Document Revised: 01/31/2014 Document Reviewed: 08/18/2013 [facility] Patient Information 2015 [place], [place]. This information is not intended to replace advice given to you by your health care provider. Make sure you discuss any questions you have with your health care provider.

## 2014-12-21 NOTE — Addendum Note (Signed)
Addended by: Kem ParkinsonBARNES, Kalianne Fetting on: 12/21/2014 03:46 PM   Modules accepted: Orders

## 2014-12-21 NOTE — Addendum Note (Signed)
Addended by: Kem ParkinsonBARNES, Valjean Ruppel on: 12/21/2014 03:54 PM   Modules accepted: Orders

## 2014-12-21 NOTE — Progress Notes (Signed)
Sandra York 08/19/1976 [id number]    History:    Presents for annual exam.  Monthly cycle on Ortho-Cyclen. Not sexually active. Had tried Loestrin in the past had spotting. Continues to have problems with bad dreams for years, is able to sleep. Normal Pap history. Has not had a mammogram.  Past medical history, past surgical history, family history and social history were all reviewed and documented in the EPIC chart. 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  ROS:  A ROS was performed and pertinent positives and negatives are included.  Exam:  Filed Vitals:   12/21/14 1013  BP: 112/72    General appearance:  Normal Thyroid:  Symmetrical, normal in size, without palpable masses or nodularity. Respiratory  Auscultation:  Clear without wheezing or rhonchi Cardiovascular  Auscultation:  Regular rate, without rubs, murmurs or gallops  Edema/varicosities:  Not grossly evident Abdominal  Soft,nontender, without masses, guarding or rebound.  Liver/spleen:  No organomegaly noted  Hernia:  None appreciated  Skin  Inspection:  Grossly normal   Breasts: Examined lying and sitting.     Right: Without masses, retractions, discharge or axillary adenopathy.     Left: Without masses, retractions, discharge or axillary adenopathy. Gentitourinary   Inguinal/mons:  Normal without inguinal adenopathy  External genitalia:  Normal  BUS/Urethra/Skene's glands:  Normal  Vagina:  Normal  Cervix:  Normal  Uterus: normal in size, shape and contour.  Midline and mobile  Adnexa/parametria:     Rt: Without masses or tenderness.   Lt: Without masses or tenderness.  Anus and perineum: 1 cm nonthrombosed hemorrhoid  Digital rectal exam: Normal sphincter tone without palpated masses or tenderness  Assessment/Plan:  39 y.o. SWF G0 for annual exam, reports noting scant bright red blood with bowel movements 2.  Hemorrhoid Monthly cycle on Ortho-Cyclen Poor rest/bad dreams.  Plan: Loestrin  prescription, proper use, slight risk for blood clots and strokes, call if problems with change. Condoms encouraged if sexually active. GC/Chlamydia, declines HIV, hepatitis or RPR. SBE's, annual screening mammogram, center information given, instructed to schedule. CBC, lipid panel, glucose, UA, Pap normal 2015, new screening guidelines reviewed. Discussed imagery/relaxation prior to sleep, continue light yoga in the evenings, avoid TV prior to bed. Analpram-HC at bedtime for several days. Call if continued noting blood with stools, ways to prevent constipation discussed, reviewed if rectal bleeding persists, changes or increases will refer for screening colonoscopy.    Sandra York [facility]enterWHNP, 12:17 PM 12/21/2014

## 2014-12-23 ENCOUNTER — Other Ambulatory Visit: Payer: Self-pay

## 2014-12-23 DIAGNOSIS — Z1239 Encounter for other screening for malignant neoplasm of breast: Secondary | ICD-10-CM

## 2015-01-09 ENCOUNTER — Telehealth: Payer: Self-pay | Admitting: *Deleted

## 2015-01-09 NOTE — Telephone Encounter (Signed)
Pt called stating someone from the office called stating she needed to have a repeat urine. I didn't see anything in chart, labs, office visit at all about this. Patient was seen on 12/21/14 for annual and no urine was done. I told pt to disregard message since no note in system.

## 2015-01-11 ENCOUNTER — Ambulatory Visit
Admission: RE | Admit: 2015-01-11 | Discharge: 2015-01-11 | Disposition: A | Discharge status: Home / Self Care | Payer: BC Managed Care – PPO | Source: Ambulatory Visit

## 2015-01-11 ENCOUNTER — Encounter: Payer: Self-pay | Admitting: Women's Health

## 2015-01-11 ENCOUNTER — Other Ambulatory Visit: Payer: Self-pay

## 2015-01-11 DIAGNOSIS — Z1231 Encounter for screening mammogram for malignant neoplasm of breast: Secondary | ICD-10-CM

## 2015-03-31 ENCOUNTER — Other Ambulatory Visit: Payer: Self-pay | Admitting: Women's Health

## 2015-12-15 ENCOUNTER — Other Ambulatory Visit: Payer: Self-pay

## 2015-12-15 DIAGNOSIS — Z1231 Encounter for screening mammogram for malignant neoplasm of breast: Secondary | ICD-10-CM

## 2015-12-22 ENCOUNTER — Encounter: Payer: Self-pay | Admitting: Women's Health

## 2015-12-22 ENCOUNTER — Ambulatory Visit (INDEPENDENT_AMBULATORY_CARE_PROVIDER_SITE_OTHER): Payer: BC Managed Care – PPO | Admitting: Women's Health

## 2015-12-22 VITALS — BP 132/80 | Ht 63.0 in | Wt 196.0 lb

## 2015-12-22 DIAGNOSIS — R5383 Other fatigue: Secondary | ICD-10-CM

## 2015-12-22 DIAGNOSIS — Z13220 Encounter for screening for lipoid disorders: Secondary | ICD-10-CM

## 2015-12-22 DIAGNOSIS — Z3041 Encounter for surveillance of contraceptive pills: Secondary | ICD-10-CM

## 2015-12-22 DIAGNOSIS — Z01419 Encounter for gynecological examination (general) (routine) without abnormal findings: Secondary | ICD-10-CM

## 2015-12-22 LAB — COMPREHENSIVE METABOLIC PANEL
ALT: 9 U/L (ref 6–29)
AST: 10 U/L (ref 10–30)
Albumin: 3.7 g/dL (ref 3.6–5.1)
Alkaline Phosphatase: 45 U/L (ref 33–115)
BUN: 13 mg/dL (ref 7–25)
CHLORIDE: 104 mmol/L (ref 98–110)
CO2: 23 mmol/L (ref 20–31)
Calcium: 9.2 mg/dL (ref 8.6–10.2)
Creat: 1.03 mg/dL (ref 0.50–1.10)
Glucose, Bld: 106 mg/dL — ABNORMAL HIGH (ref 65–99)
POTASSIUM: 3.6 mmol/L (ref 3.5–5.3)
Sodium: 138 mmol/L (ref 135–146)
TOTAL PROTEIN: 6.8 g/dL (ref 6.1–8.1)
Total Bilirubin: 0.3 mg/dL (ref 0.2–1.2)

## 2015-12-22 LAB — CBC WITH DIFFERENTIAL/PLATELET
BASOS PCT: 1 % (ref 0–1)
Basophils Absolute: 0.1 10*3/uL (ref 0.0–0.1)
Eosinophils Absolute: 0.2 10*3/uL (ref 0.0–0.7)
Eosinophils Relative: 3 % (ref 0–5)
HCT: 41.3 % (ref 36.0–46.0)
Hemoglobin: 13.5 g/dL (ref 12.0–15.0)
LYMPHS ABS: 1.6 10*3/uL (ref 0.7–4.0)
Lymphocytes Relative: 27 % (ref 12–46)
MCH: 30.6 pg (ref 26.0–34.0)
MCHC: 32.7 g/dL (ref 30.0–36.0)
MCV: 93.7 fL (ref 78.0–100.0)
MONO ABS: 0.4 10*3/uL (ref 0.1–1.0)
MONOS PCT: 6 % (ref 3–12)
MPV: 10.2 fL (ref 8.6–12.4)
Neutro Abs: 3.7 10*3/uL (ref 1.7–7.7)
Neutrophils Relative %: 63 % (ref 43–77)
Platelets: 298 10*3/uL (ref 150–400)
RBC: 4.41 MIL/uL (ref 3.87–5.11)
RDW: 13 % (ref 11.5–15.5)
WBC: 5.9 10*3/uL (ref 4.0–10.5)

## 2015-12-22 LAB — TSH: TSH: 0.67 mIU/L

## 2015-12-22 LAB — LIPID PANEL
CHOL/HDL RATIO: 2.7 ratio (ref <=5.0)
CHOLESTEROL: 200 mg/dL (ref 125–200)
HDL: 75 mg/dL (ref >=46)
LDL Cholesterol: 113 mg/dL (ref <130)
TRIGLYCERIDES: 60 mg/dL (ref <150)
VLDL: 12 mg/dL (ref <30)

## 2015-12-22 MED ORDER — NORETHIN ACE-ETH ESTRAD-FE 1-20 MG-MCG PO TABS: 1.0000 | ORAL_TABLET | Freq: Every day | ORAL | Status: DC

## 2015-12-22 NOTE — Patient Instructions (Signed)
Health Maintenance, Female Adopting a healthy lifestyle and getting preventive care can go a long way to promote health and wellness. Talk with your health care provider about what schedule of regular examinations is right for you. This is a good chance for you to check in with your provider about disease prevention and staying healthy. In between checkups, there are plenty of things you can do on your own. Experts have done a lot of research about which lifestyle changes and preventive measures are most likely to keep you healthy. Ask your health care provider for more information. WEIGHT AND DIET  Eat a healthy diet  Be sure to include plenty of vegetables, fruits, low-fat dairy products, and lean protein.  Do not eat a lot of foods high in solid fats, added sugars, or salt.  Get regular exercise. This is one of the most important things you can do for your health.  Most adults should exercise for at least 150 minutes each week. The exercise should increase your heart rate and make you sweat (moderate-intensity exercise).  Most adults should also do strengthening exercises at least twice a week. This is in addition to the moderate-intensity exercise.  Maintain a healthy weight  Body mass index (BMI) is a measurement that can be used to identify possible weight problems. It estimates body fat based on height and weight. Your health care provider can help determine your BMI and help you achieve or maintain a healthy weight.  For females 20 years of age and older:   A BMI below 18.5 is considered underweight.  A BMI of 18.5 to 24.9 is normal.  A BMI of 25 to 29.9 is considered overweight.  A BMI of 30 and above is considered obese.  Watch levels of cholesterol and blood lipids  You should start having your blood tested for lipids and cholesterol at 42 years of age, then have this test every 5 years.  You may need to have your cholesterol levels checked more often if:  Your lipid  or cholesterol levels are high.  You are older than 42 years of age.  You are at high risk for heart disease.  CANCER SCREENING   Lung Cancer  Lung cancer screening is recommended for adults 55-80 years old who are at high risk for lung cancer because of a history of smoking.  A yearly low-dose CT scan of the lungs is recommended for people who:  Currently smoke.  Have quit within the past 15 years.  Have at least a 30-pack-year history of smoking. A pack year is smoking an average of one pack of cigarettes a day for 1 year.  Yearly screening should continue until it has been 15 years since you quit.  Yearly screening should stop if you develop a health problem that would prevent you from having lung cancer treatment.  Breast Cancer  Practice breast self-awareness. This means understanding how your breasts normally appear and feel.  It also means doing regular breast self-exams. Let your health care provider know about any changes, no matter how small.  If you are in your 20s or 30s, you should have a clinical breast exam (CBE) by a health care provider every 1-3 years as part of a regular health exam.  If you are 40 or older, have a CBE every year. Also consider having a breast X-ray (mammogram) every year.  If you have a family history of breast cancer, talk to your health care provider about genetic screening.  If you   are at high risk for breast cancer, talk to your health care provider about having an MRI and a mammogram every year.  Breast cancer gene (BRCA) assessment is recommended for women who have family members with BRCA-related cancers. BRCA-related cancers include:  Breast.  Ovarian.  Tubal.  Peritoneal cancers.  Results of the assessment will determine the need for genetic counseling and BRCA1 and BRCA2 testing. Cervical Cancer Your health care provider may recommend that you be screened regularly for cancer of the pelvic organs (ovaries, uterus, and  vagina). This screening involves a pelvic examination, including checking for microscopic changes to the surface of your cervix (Pap test). You may be encouraged to have this screening done every 3 years, beginning at age 21.  For women ages 30-65, health care providers may recommend pelvic exams and Pap testing every 3 years, or they may recommend the Pap and pelvic exam, combined with testing for human papilloma virus (HPV), every 5 years. Some types of HPV increase your risk of cervical cancer. Testing for HPV may also be done on women of any age with unclear Pap test results.  Other health care providers may not recommend any screening for nonpregnant women who are considered low risk for pelvic cancer and who do not have symptoms. Ask your health care provider if a screening pelvic exam is right for you.  If you have had past treatment for cervical cancer or a condition that could lead to cancer, you need Pap tests and screening for cancer for at least 20 years after your treatment. If Pap tests have been discontinued, your risk factors (such as having a new sexual partner) need to be reassessed to determine if screening should resume. Some women have medical problems that increase the chance of getting cervical cancer. In these cases, your health care provider may recommend more frequent screening and Pap tests. Colorectal Cancer  This type of cancer can be detected and often prevented.  Routine colorectal cancer screening usually begins at 42 years of age and continues through 42 years of age.  Your health care provider may recommend screening at an earlier age if you have risk factors for colon cancer.  Your health care provider may also recommend using home test kits to check for hidden blood in the stool.  A small camera at the end of a tube can be used to examine your colon directly (sigmoidoscopy or colonoscopy). This is done to check for the earliest forms of colorectal  cancer.  Routine screening usually begins at age 50.  Direct examination of the colon should be repeated every 5-10 years through 42 years of age. However, you may need to be screened more often if early forms of precancerous polyps or small growths are found. Skin Cancer  Check your skin from head to toe regularly.  Tell your health care provider about any new moles or changes in moles, especially if there is a change in a mole's shape or color.  Also tell your health care provider if you have a mole that is larger than the size of a pencil eraser.  Always use sunscreen. Apply sunscreen liberally and repeatedly throughout the day.  Protect yourself by wearing long sleeves, pants, a wide-brimmed hat, and sunglasses whenever you are outside. HEART DISEASE, DIABETES, AND HIGH BLOOD PRESSURE   High blood pressure causes heart disease and increases the risk of stroke. High blood pressure is more likely to develop in:  People who have blood pressure in the high end   of the normal range (130-139/85-89 mm Hg).  People who are overweight or obese.  People who are African American.  If you are 38-23 years of age, have your blood pressure checked every 3-5 years. If you are 61 years of age or older, have your blood pressure checked every year. You should have your blood pressure measured twice--once when you are at a hospital or clinic, and once when you are not at a hospital or clinic. Record the average of the two measurements. To check your blood pressure when you are not at a hospital or clinic, you can use:  An automated blood pressure machine at a pharmacy.  A home blood pressure monitor.  If you are between 45 years and 39 years old, ask your health care provider if you should take aspirin to prevent strokes.  Have regular diabetes screenings. This involves taking a blood sample to check your fasting blood sugar level.  If you are at a normal weight and have a low risk for diabetes,  have this test once every three years after 42 years of age.  If you are overweight and have a high risk for diabetes, consider being tested at a younger age or more often. PREVENTING INFECTION  Hepatitis B  If you have a higher risk for hepatitis B, you should be screened for this virus. You are considered at high risk for hepatitis B if:  You were born in a country where hepatitis B is common. Ask your health care provider which countries are considered high risk.  Your parents were born in a high-risk country, and you have not been immunized against hepatitis B (hepatitis B vaccine).  You have HIV or AIDS.  You use needles to inject street drugs.  You live with someone who has hepatitis B.  You have had sex with someone who has hepatitis B.  You get hemodialysis treatment.  You take certain medicines for conditions, including cancer, organ transplantation, and autoimmune conditions. Hepatitis C  Blood testing is recommended for:  Everyone born from [birth year] through 1967.  Anyone with known risk factors for hepatitis C. Sexually transmitted infections (STIs)  You should be screened for sexually transmitted infections (STIs) including gonorrhea and chlamydia if:  You are sexually active and are younger than 42 years of age.  You are older than 42 years of age and your health care provider tells you that you are at risk for this type of infection.  Your sexual activity has changed since you were last screened and you are at an increased risk for chlamydia or gonorrhea. Ask your health care provider if you are at risk.  If you do not have HIV, but are at risk, it may be recommended that you take a prescription medicine daily to prevent HIV infection. This is called pre-exposure prophylaxis (PrEP). You are considered at risk if:  You are sexually active and do not regularly use condoms or know the HIV status of your partner(s).  You take drugs by injection.  You are sexually  active with a partner who has HIV. Talk with your health care provider about whether you are at high risk of being infected with HIV. If you choose to begin PrEP, you should first be tested for HIV. You should then be tested every 3 months for as long as you are taking PrEP.  PREGNANCY   If you are premenopausal and you may become pregnant, ask your health care provider about preconception counseling.  If you may  become pregnant, take 400 to 800 micrograms (mcg) of folic acid every day.  If you want to prevent pregnancy, talk to your health care provider about birth control (contraception). OSTEOPOROSIS AND MENOPAUSE   Osteoporosis is a disease in which the bones lose minerals and strength with aging. This can result in serious bone fractures. Your risk for osteoporosis can be identified using a bone density scan.  If you are 61 years of age or older, or if you are at risk for osteoporosis and fractures, ask your health care provider if you should be screened.  Ask your health care provider whether you should take a calcium or vitamin D supplement to lower your risk for osteoporosis.  Menopause may have certain physical symptoms and risks.  Hormone replacement therapy may reduce some of these symptoms and risks. Talk to your health care provider about whether hormone replacement therapy is right for you.  HOME CARE INSTRUCTIONS   Schedule regular health, dental, and eye exams.  Stay current with your immunizations.   Do not use any tobacco products including cigarettes, chewing tobacco, or electronic cigarettes.  If you are pregnant, do not drink alcohol.  If you are breastfeeding, limit how much and how often you drink alcohol.  Limit alcohol intake to no more than 1 drink per day for nonpregnant women. One drink equals 12 ounces of beer, 5 ounces of wine, or 1 ounces of hard liquor.  Do not use street drugs.  Do not share needles.  Ask your health care provider for help if  you need support or information about quitting drugs.  Tell your health care provider if you often feel depressed.  Tell your health care provider if you have ever been abused or do not feel safe at home.   This information is not intended to replace advice given to you by your health care provider. Make sure you discuss any questions you have with your health care provider.   Document Released: 04/01/2011 Document Revised: 10/07/2014 Document Reviewed: 08/18/2013 Elsevier Interactive Patient Education [employer].

## 2015-12-22 NOTE — Progress Notes (Signed)
Tykisha A Rountree 12/07/1975 [id number]    History:    Presents for annual exam.  Monthly cycle on Loestrin. Not sexually active years. Normal Pap and mammogram history.   Past medical history, past surgical history, family history and social history were all reviewed and documented in the EPIC chart. 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 Going to [place] this summer. Father hypertension.  ROS:  A ROS was performed and pertinent positives and negatives are included.  Exam:  Filed Vitals:   12/22/15 0944  BP: 132/80    General appearance:  Normal Thyroid:  Symmetrical, normal in size, without palpable masses or nodularity. Respiratory  Auscultation:  Clear without wheezing or rhonchi Cardiovascular  Auscultation:  Regular rate, without rubs, murmurs or gallops  Edema/varicosities:  Not grossly evident Abdominal  Soft,nontender, without masses, guarding or rebound.  Liver/spleen:  No organomegaly noted  Hernia:  None appreciated  Skin  Inspection:  Grossly normal   Breasts: Examined lying and sitting.     Right: Without masses, retractions, discharge or axillary adenopathy.     Left: Without masses, retractions, discharge or axillary adenopathy. Gentitourinary   Inguinal/mons:  Normal without inguinal adenopathy  External genitalia:  Normal  BUS/Urethra/Skene's glands:  Normal  Vagina:  Normal  Cervix:  Normal  Uterus:  normal in size, shape and contour.  Midline and mobile  Adnexa/parametria:     Rt: Without masses or tenderness.   Lt: Without masses or tenderness.  Anus and perineum: Normal  Digital rectal exam: Normal sphincter tone without palpated masses or tenderness  Assessment/Plan:  40 y.o. S WF G0 for annual exam no complaints other than fatigue at times.  Light monthly cycle on Loestrin Overweight  Plan: Loestrin 1/20 prescription, proper use given and reviewed slight risk for blood clots and strokes. SBEs, continue annual 3-D screening  mammogram history of dense breasts. Encouraged to increase regular exercise and decrease calories for weight loss (10 poundwt gain past year). CBC, TSH, lipid panel, UA, Pap normal 2015, new screening guidelines reviewed.    Harrington ChallengerYOUNG,NANCY J [facility] Wayne HospitalWHNP, 10:39 AM 12/22/2015

## 2015-12-23 LAB — URINALYSIS W MICROSCOPIC + REFLEX CULTURE
Bilirubin Urine: NEGATIVE
Casts: NONE SEEN [LPF]
Crystals: NONE SEEN [HPF]
Glucose, UA: NEGATIVE
KETONES UR: NEGATIVE
Leukocytes, UA: NEGATIVE
Nitrite: POSITIVE — AB
PROTEIN: NEGATIVE
RBC / HPF: NONE SEEN RBC/HPF (ref <=2)
Specific Gravity, Urine: 1.016 (ref 1.001–1.035)
Yeast: NONE SEEN [HPF]
pH: 6.5 (ref 5.0–8.0)

## 2015-12-25 ENCOUNTER — Telehealth: Payer: Self-pay | Admitting: Women's Health

## 2015-12-25 LAB — URINE CULTURE: Colony Count: >=100000

## 2015-12-25 NOTE — Telephone Encounter (Signed)
Telephone call, reviewed blood sugar slightly elevated, it was not a fasting blood sugar, reviewed importance of increasing regular exercise, avoiding simple sugars. No type 2 diabetes in family history. Check hemoglobin A1c next year.

## 2015-12-25 NOTE — Telephone Encounter (Signed)
-----   Message from Keenan BachelorKatherine R Annas, [place]MA sent at 12/25/2015 11:59 AM EDT ----- Bryn GullingNancy, Nicole said patient will have to return for HgbA1c. Cannot do it with blood that was drawn. Do you want her to return?

## 2015-12-26 ENCOUNTER — Telehealth: Payer: Self-pay | Admitting: Women's Health

## 2015-12-26 NOTE — Telephone Encounter (Signed)
.  tc

## 2015-12-27 ENCOUNTER — Other Ambulatory Visit: Payer: Self-pay | Admitting: Women's Health

## 2015-12-27 ENCOUNTER — Encounter: Payer: BC Managed Care – PPO | Admitting: Women's Health

## 2015-12-27 DIAGNOSIS — N3001 Acute cystitis with hematuria: Secondary | ICD-10-CM

## 2015-12-27 MED ORDER — SULFAMETHOXAZOLE-TRIMETHOPRIM 800-160 MG PO TABS: 1.0000 | ORAL_TABLET | Freq: Two times a day (BID) | ORAL | Status: DC

## 2015-12-27 NOTE — Progress Notes (Signed)
Spoke with patient. Informed her. Rx sent.

## 2016-01-23 ENCOUNTER — Ambulatory Visit
Admission: RE | Admit: 2016-01-23 | Discharge: 2016-01-23 | Disposition: A | Discharge status: Home / Self Care | Payer: BC Managed Care – PPO | Source: Ambulatory Visit

## 2016-01-23 ENCOUNTER — Encounter: Payer: Self-pay | Admitting: Women's Health

## 2016-01-23 DIAGNOSIS — Z1231 Encounter for screening mammogram for malignant neoplasm of breast: Secondary | ICD-10-CM

## 2016-04-14 ENCOUNTER — Other Ambulatory Visit: Payer: Self-pay | Admitting: Women's Health

## 2016-05-31 ENCOUNTER — Encounter: Payer: Self-pay | Admitting: Women's Health

## 2016-12-19 ENCOUNTER — Other Ambulatory Visit: Payer: Self-pay | Admitting: Women's Health

## 2016-12-19 DIAGNOSIS — Z1231 Encounter for screening mammogram for malignant neoplasm of breast: Secondary | ICD-10-CM

## 2016-12-27 ENCOUNTER — Encounter: Payer: BC Managed Care – PPO | Admitting: Women's Health

## 2016-12-30 ENCOUNTER — Other Ambulatory Visit: Payer: Self-pay | Admitting: Women's Health

## 2016-12-30 DIAGNOSIS — Z3041 Encounter for surveillance of contraceptive pills: Secondary | ICD-10-CM

## 2017-01-13 ENCOUNTER — Ambulatory Visit (INDEPENDENT_AMBULATORY_CARE_PROVIDER_SITE_OTHER): Payer: BC Managed Care – PPO | Admitting: Women's Health

## 2017-01-13 ENCOUNTER — Encounter: Payer: Self-pay | Admitting: Women's Health

## 2017-01-13 VITALS — BP 132/80 | Ht 63.0 in | Wt 199.0 lb

## 2017-01-13 DIAGNOSIS — Z1151 Encounter for screening for human papillomavirus (HPV): Secondary | ICD-10-CM

## 2017-01-13 DIAGNOSIS — Z01419 Encounter for gynecological examination (general) (routine) without abnormal findings: Secondary | ICD-10-CM | POA: Diagnosis not present

## 2017-01-13 DIAGNOSIS — Z13220 Encounter for screening for lipoid disorders: Secondary | ICD-10-CM

## 2017-01-13 DIAGNOSIS — Z8639 Personal history of other endocrine, nutritional and metabolic disease: Secondary | ICD-10-CM | POA: Diagnosis not present

## 2017-01-13 DIAGNOSIS — Z3041 Encounter for surveillance of contraceptive pills: Secondary | ICD-10-CM | POA: Diagnosis not present

## 2017-01-13 LAB — CBC WITH DIFFERENTIAL/PLATELET
BASOS ABS: 104 {cells}/uL (ref 0–200)
Basophils Relative: 2 %
EOS PCT: 2 %
Eosinophils Absolute: 104 cells/uL (ref 15–500)
HCT: 41.5 % (ref 35.0–45.0)
Hemoglobin: 13.4 g/dL (ref 11.7–15.5)
Lymphocytes Relative: 31 %
Lymphs Abs: 1612 cells/uL (ref 850–3900)
MCH: 30.7 pg (ref 27.0–33.0)
MCHC: 32.3 g/dL (ref 32.0–36.0)
MCV: 95.2 fL (ref 80.0–100.0)
MONOS PCT: 7 %
MPV: 9.5 fL (ref 7.5–12.5)
Monocytes Absolute: 364 cells/uL (ref 200–950)
NEUTROS ABS: 3016 {cells}/uL (ref 1500–7800)
Neutrophils Relative %: 58 %
PLATELETS: 338 10*3/uL (ref 140–400)
RBC: 4.36 MIL/uL (ref 3.80–5.10)
RDW: 13.1 % (ref 11.0–15.0)
WBC: 5.2 10*3/uL (ref 3.8–10.8)

## 2017-01-13 LAB — LIPID PANEL
CHOL/HDL RATIO: 2.9 ratio (ref <5.0)
CHOLESTEROL: 216 mg/dL — AB (ref <200)
HDL: 75 mg/dL (ref >50)
LDL Cholesterol: 115 mg/dL — ABNORMAL HIGH (ref <100)
Triglycerides: 128 mg/dL (ref <150)
VLDL: 26 mg/dL (ref <30)

## 2017-01-13 MED ORDER — NORETHIN ACE-ETH ESTRAD-FE 1-20 MG-MCG PO TABS: 1.0000 | ORAL_TABLET | Freq: Every day | ORAL | 4 refills | Status: DC

## 2017-01-13 MED ORDER — NORETHIN ACE-ETH ESTRAD-FE 1-20 MG-MCG PO TABS: 1.0000 | ORAL_TABLET | Freq: Every day | ORAL | 0 refills | Status: DC

## 2017-01-13 NOTE — Patient Instructions (Signed)
Carbohydrate Counting for Diabetes Mellitus, Adult Carbohydrate counting is a method for keeping track of how many carbohydrates you eat. Eating carbohydrates naturally increases the amount of sugar (glucose) in the blood. Counting how many carbohydrates you eat helps keep your blood glucose within normal limits, which helps you manage your diabetes (diabetes mellitus). It is important to know how many carbohydrates you can safely have in each meal. This is different for every person. A diet and nutrition specialist (registered dietitian) can help you make a meal plan and calculate how many carbohydrates you should have at each meal and snack. Carbohydrates are found in the following foods:  Grains, such as breads and cereals.  Dried beans and soy products.  Starchy vegetables, such as potatoes, peas, and corn.  Fruit and fruit juices.  Milk and yogurt.  Sweets and snack foods, such as cake, cookies, candy, chips, and soft drinks. How do I count carbohydrates? There are two ways to count carbohydrates in food. You can use either of the methods or a combination of both. Reading "Nutrition Facts" on packaged food  The "Nutrition Facts" list is included on the labels of almost all packaged foods and beverages in the [place] It includes:  The serving size.  Information about nutrients in each serving, including the grams (g) of carbohydrate per serving. To use the "Nutrition Facts":  Decide how many servings you will have.  Multiply the number of servings by the number of carbohydrates per serving.  The resulting number is the total amount of carbohydrates that you will be having. Learning standard serving sizes of other foods  When you eat foods containing carbohydrates that are not packaged or do not include "Nutrition Facts" on the label, you need to measure the servings in order to count the amount of carbohydrates:  Measure the foods that you will eat with a food scale or measuring  cup, if needed.  Decide how many standard-size servings you will eat.  Multiply the number of servings by 15. Most carbohydrate-rich foods have about 15 g of carbohydrates per serving.  For example, if you eat 8 oz (170 g) of strawberries, you will have eaten 2 servings and 30 g of carbohydrates (2 servings x 15 g = 30 g).  For foods that have more than one food mixed, such as soups and casseroles, you must count the carbohydrates in each food that is included. The following list contains standard serving sizes of common carbohydrate-rich foods. Each of these servings has about 15 g of carbohydrates:   hamburger bun or  English muffin.   oz (15 mL) syrup.   oz (14 g) jelly.  1 slice of bread.  1 six-inch tortilla.  3 oz (85 g) cooked rice or pasta.  4 oz (113 g) cooked dried beans.  4 oz (113 g) starchy vegetable, such as peas, corn, or potatoes.  4 oz (113 g) hot cereal.  4 oz (113 g) mashed potatoes or  of a large baked potato.  4 oz (113 g) canned or frozen fruit.  4 oz (120 mL) fruit juice.  4-6 crackers.  6 chicken nuggets.  6 oz (170 g) unsweetened dry cereal.  6 oz (170 g) plain fat-free yogurt or yogurt sweetened with artificial sweeteners.  8 oz (240 mL) milk.  8 oz (170 g) fresh fruit or one small piece of fruit.  24 oz (680 g) popped popcorn. Example of carbohydrate counting Sample meal   3 oz (85 g) chicken breast.  6  oz (170 g) brown rice.  4 oz (113 g) corn.  8 oz (240 mL) milk.  8 oz (170 g) strawberries with sugar-free whipped topping. Carbohydrate calculation  1. Identify the foods that contain carbohydrates:  Rice.  Corn.  Milk.  Strawberries. 2. Calculate how many servings you have of each food:  2 servings rice.  1 serving corn.  1 serving milk.  1 serving strawberries. 3. Multiply each number of servings by 15 g:  2 servings rice x 15 g = 30 g.  1 serving corn x 15 g = 15 g.  1 serving milk x 15 g = 15  g.  1 serving strawberries x 15 g = 15 g. 4. Add together all of the amounts to find the total grams of carbohydrates eaten:  30 g + 15 g + 15 g + 15 g = 75 g of carbohydrates total. This information is not intended to replace advice given to you by your health care provider. Make sure you discuss any questions you have with your health care provider. Document Released: 09/16/2005 Document Revised: 04/05/2016 Document Reviewed: 02/28/2016 Elsevier Interactive Patient Education  2017 [employer] Health Maintenance, Female Adopting a healthy lifestyle and getting preventive care can go a long way to promote health and wellness. Talk with your health care provider about what schedule of regular examinations is right for you. This is a good chance for you to check in with your provider about disease prevention and staying healthy. In between checkups, there are plenty of things you can do on your own. Experts have done a lot of research about which lifestyle changes and preventive measures are most likely to keep you healthy. Ask your health care provider for more information. Weight and diet Eat a healthy diet  Be sure to include plenty of vegetables, fruits, low-fat dairy products, and lean protein.  Do not eat a lot of foods high in solid fats, added sugars, or salt.  Get regular exercise. This is one of the most important things you can do for your health.  Most adults should exercise for at least 150 minutes each week. The exercise should increase your heart rate and make you sweat (moderate-intensity exercise).  Most adults should also do strengthening exercises at least twice a week. This is in addition to the moderate-intensity exercise. Maintain a healthy weight  Body mass index (BMI) is a measurement that can be used to identify possible weight problems. It estimates body fat based on height and weight. Your health care provider can help determine your BMI and help you achieve or  maintain a healthy weight.  For females 20 years of age and older:  A BMI below 18.5 is considered underweight.  A BMI of 18.5 to 24.9 is normal.  A BMI of 25 to 29.9 is considered overweight.  A BMI of 30 and above is considered obese. Watch levels of cholesterol and blood lipids  You should start having your blood tested for lipids and cholesterol at 43 years of age, then have this test every 5 years.  You may need to have your cholesterol levels checked more often if:  Your lipid or cholesterol levels are high.  You are older than 43 years of age.  You are at high risk for heart disease. Cancer screening Lung Cancer  Lung cancer screening is recommended for adults 55-80 years old who are at high risk for lung cancer because of a history of smoking.  A yearly low-dose CT   scan of the lungs is recommended for people who:  Currently smoke.  Have quit within the past 15 years.  Have at least a 30-pack-year history of smoking. A pack year is smoking an average of one pack of cigarettes a day for 1 year.  Yearly screening should continue until it has been 15 years since you quit.  Yearly screening should stop if you develop a health problem that would prevent you from having lung cancer treatment. Breast Cancer  Practice breast self-awareness. This means understanding how your breasts normally appear and feel.  It also means doing regular breast self-exams. Let your health care provider know about any changes, no matter how small.  If you are in your 20s or 30s, you should have a clinical breast exam (CBE) by a health care provider every 1-3 years as part of a regular health exam.  If you are 40 or older, have a CBE every year. Also consider having a breast X-ray (mammogram) every year.  If you have a family history of breast cancer, talk to your health care provider about genetic screening.  If you are at high risk for breast cancer, talk to your health care provider  about having an MRI and a mammogram every year.  Breast cancer gene (BRCA) assessment is recommended for women who have family members with BRCA-related cancers. BRCA-related cancers include:  Breast.  Ovarian.  Tubal.  Peritoneal cancers.  Results of the assessment will determine the need for genetic counseling and BRCA1 and BRCA2 testing. Cervical Cancer  Your health care provider may recommend that you be screened regularly for cancer of the pelvic organs (ovaries, uterus, and vagina). This screening involves a pelvic examination, including checking for microscopic changes to the surface of your cervix (Pap test). You may be encouraged to have this screening done every 3 years, beginning at age 21.  For women ages 30-65, health care providers may recommend pelvic exams and Pap testing every 3 years, or they may recommend the Pap and pelvic exam, combined with testing for human papilloma virus (HPV), every 5 years. Some types of HPV increase your risk of cervical cancer. Testing for HPV may also be done on women of any age with unclear Pap test results.  Other health care providers may not recommend any screening for nonpregnant women who are considered low risk for pelvic cancer and who do not have symptoms. Ask your health care provider if a screening pelvic exam is right for you.  If you have had past treatment for cervical cancer or a condition that could lead to cancer, you need Pap tests and screening for cancer for at least 20 years after your treatment. If Pap tests have been discontinued, your risk factors (such as having a new sexual partner) need to be reassessed to determine if screening should resume. Some women have medical problems that increase the chance of getting cervical cancer. In these cases, your health care provider may recommend more frequent screening and Pap tests. Colorectal Cancer  This type of cancer can be detected and often prevented.  Routine colorectal  cancer screening usually begins at 43 years of age and continues through 43 years of age.  Your health care provider may recommend screening at an earlier age if you have risk factors for colon cancer.  Your health care provider may also recommend using home test kits to check for hidden blood in the stool.  A small camera at the end of a tube can be used   to examine your colon directly (sigmoidoscopy or colonoscopy). This is done to check for the earliest forms of colorectal cancer.  Routine screening usually begins at age 69.  Direct examination of the colon should be repeated every 5-10 years through 43 years of age. However, you may need to be screened more often if early forms of precancerous polyps or small growths are found. Skin Cancer  Check your skin from head to toe regularly.  Tell your health care provider about any new moles or changes in moles, especially if there is a change in a mole's shape or color.  Also tell your health care provider if you have a mole that is larger than the size of a pencil eraser.  Always use sunscreen. Apply sunscreen liberally and repeatedly throughout the day.  Protect yourself by wearing long sleeves, pants, a wide-brimmed hat, and sunglasses whenever you are outside. Heart disease, diabetes, and high blood pressure  High blood pressure causes heart disease and increases the risk of stroke. High blood pressure is more likely to develop in:  People who have blood pressure in the high end of the normal range (130-139/85-89 mm Hg).  People who are overweight or obese.  People who are African American.  If you are 6-34 years of age, have your blood pressure checked every 3-5 years. If you are 54 years of age or older, have your blood pressure checked every year. You should have your blood pressure measured twice-once when you are at a hospital or clinic, and once when you are not at a hospital or clinic. Record the average of the two  measurements. To check your blood pressure when you are not at a hospital or clinic, you can use:  An automated blood pressure machine at a pharmacy.  A home blood pressure monitor.  If you are between 36 years and 36 years old, ask your health care provider if you should take aspirin to prevent strokes.  Have regular diabetes screenings. This involves taking a blood sample to check your fasting blood sugar level.  If you are at a normal weight and have a low risk for diabetes, have this test once every three years after 43 years of age.  If you are overweight and have a high risk for diabetes, consider being tested at a younger age or more often. Preventing infection Hepatitis B  If you have a higher risk for hepatitis B, you should be screened for this virus. You are considered at high risk for hepatitis B if:  You were born in a country where hepatitis B is common. Ask your health care provider which countries are considered high risk.  Your parents were born in a high-risk country, and you have not been immunized against hepatitis B (hepatitis B vaccine).  You have HIV or AIDS.  You use needles to inject street drugs.  You live with someone who has hepatitis B.  You have had sex with someone who has hepatitis B.  You get hemodialysis treatment.  You take certain medicines for conditions, including cancer, organ transplantation, and autoimmune conditions. Hepatitis C  Blood testing is recommended for:  Everyone born from [birth year] through 1967.  Anyone with known risk factors for hepatitis C. Sexually transmitted infections (STIs)  You should be screened for sexually transmitted infections (STIs) including gonorrhea and chlamydia if:  You are sexually active and are younger than 43 years of age.  You are older than 43 years of age and your health care provider  tells you that you are at risk for this type of infection.  Your sexual activity has changed since you were last  screened and you are at an increased risk for chlamydia or gonorrhea. Ask your health care provider if you are at risk.  If you do not have HIV, but are at risk, it may be recommended that you take a prescription medicine daily to prevent HIV infection. This is called pre-exposure prophylaxis (PrEP). You are considered at risk if:  You are sexually active and do not regularly use condoms or know the HIV status of your partner(s).  You take drugs by injection.  You are sexually active with a partner who has HIV. Talk with your health care provider about whether you are at high risk of being infected with HIV. If you choose to begin PrEP, you should first be tested for HIV. You should then be tested every 3 months for as long as you are taking PrEP. Pregnancy  If you are premenopausal and you may become pregnant, ask your health care provider about preconception counseling.  If you may become pregnant, take 400 to 800 micrograms (mcg) of folic acid every day.  If you want to prevent pregnancy, talk to your health care provider about birth control (contraception). Osteoporosis and menopause  Osteoporosis is a disease in which the bones lose minerals and strength with aging. This can result in serious bone fractures. Your risk for osteoporosis can be identified using a bone density scan.  If you are 30 years of age or older, or if you are at risk for osteoporosis and fractures, ask your health care provider if you should be screened.  Ask your health care provider whether you should take a calcium or vitamin D supplement to lower your risk for osteoporosis.  Menopause may have certain physical symptoms and risks.  Hormone replacement therapy may reduce some of these symptoms and risks. Talk to your health care provider about whether hormone replacement therapy is right for you. Follow these instructions at home:  Schedule regular health, dental, and eye exams.  Stay current with your  immunizations.  Do not use any tobacco products including cigarettes, chewing tobacco, or electronic cigarettes.  If you are pregnant, do not drink alcohol.  If you are breastfeeding, limit how much and how often you drink alcohol.  Limit alcohol intake to no more than 1 drink per day for nonpregnant women. One drink equals 12 ounces of beer, 5 ounces of wine, or 1 ounces of hard liquor.  Do not use street drugs.  Do not share needles.  Ask your health care provider for help if you need support or information about quitting drugs.  Tell your health care provider if you often feel depressed.  Tell your health care provider if you have ever been abused or do not feel safe at home. This information is not intended to replace advice given to you by your health care provider. Make sure you discuss any questions you have with your health care provider. Document Released: 04/01/2011 Document Revised: 02/22/2016 Document Reviewed: 06/20/2015 Elsevier Interactive Patient Education  2017 [employer].

## 2017-01-13 NOTE — Progress Notes (Signed)
Sandra York 04-Jan-1976 [id number]    History:    Presents for annual exam.  Monthly cycle on Loestrin without complaint. Not sexually active in years. Normal Pap and mammogram history.  Past medical history, past surgical history, family history and social history were all reviewed and documented in the EPIC chart. 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 Going to [place] for the summer with family this year. Father hypertension.  ROS:  A ROS was performed and pertinent positives and negatives are included.  Exam:  Vitals:   01/13/17 0921  Weight: 199 lb (90.3 kg)  Height:  (1.6 m)   Body mass index is 35.25 kg/m.   General appearance:  Normal Thyroid:  Symmetrical, normal in size, without palpable masses or nodularity. Respiratory  Auscultation:  Clear without wheezing or rhonchi Cardiovascular  Auscultation:  Regular rate, without rubs, murmurs or gallops  Edema/varicosities:  Not grossly evident Abdominal  Soft,nontender, without masses, guarding or rebound.  Liver/spleen:  No organomegaly noted  Hernia:  None appreciated  Skin  Inspection:  Grossly normal   Breasts: Examined lying and sitting.     Right: Without masses, retractions, discharge or axillary adenopathy.     Left: Without masses, retractions, discharge or axillary adenopathy. Gentitourinary   Inguinal/mons:  Normal without inguinal adenopathy  External genitalia:  Normal  BUS/Urethra/Skene's glands:  Normal  Vagina:  Normal  Cervix:  Normal  Uterus:  normal in size, shape and contour.  Midline and mobile  Adnexa/parametria:     Rt: Without masses or tenderness.   Lt: Without masses or tenderness.  Anus and perineum: Normal  Digital rectal exam: Normal sphincter tone without palpated masses or tenderness  Assessment/Plan:  41 y.o. S WF G0  for annual exam no complaints.  Monthly cycle on Loestrin Obesity  Plan: SBE's, continue annual 3-D  screening mammogram history of dense breasts. Loestrin 1/20 prescription, proper use given and reviewed slight risk for blood clots and strokes. Condoms encouraged if sexually active. History continue healthy diet, increasing exercise for weight loss. CBC, lipid panel, hemoglobin A1c, Pap with HR HPV typing, new screening guidelines reviewed.Harrington Challenger [facility], 10:18 AM 01/13/2017

## 2017-01-13 NOTE — Addendum Note (Signed)
Addended by: Berna Spare A on: 01/13/2017 11:29 AM   Modules accepted: Orders

## 2017-01-14 ENCOUNTER — Encounter: Payer: Self-pay | Admitting: Women's Health

## 2017-01-14 LAB — HEMOGLOBIN A1C
Hgb A1c MFr Bld: 5.3 % (ref <5.7)
MEAN PLASMA GLUCOSE: 105 mg/dL

## 2017-01-15 LAB — PAP, TP IMAGING W/ HPV RNA, RFLX HPV TYPE 16,18/45: HPV MRNA, HIGH RISK: NOT DETECTED

## 2017-01-19 ENCOUNTER — Encounter: Payer: Self-pay | Admitting: Women's Health

## 2017-01-31 ENCOUNTER — Ambulatory Visit
Admission: RE | Admit: 2017-01-31 | Discharge: 2017-01-31 | Disposition: A | Discharge status: Home / Self Care | Payer: BC Managed Care – PPO | Source: Ambulatory Visit | Attending: Women's Health | Admitting: Women's Health

## 2017-01-31 ENCOUNTER — Encounter: Payer: Self-pay | Admitting: Women's Health

## 2017-01-31 DIAGNOSIS — Z1231 Encounter for screening mammogram for malignant neoplasm of breast: Secondary | ICD-10-CM

## 2017-02-12 ENCOUNTER — Encounter: Payer: Self-pay | Admitting: Gynecology

## 2017-10-07 ENCOUNTER — Encounter: Payer: Self-pay | Admitting: Women's Health

## 2017-10-13 ENCOUNTER — Encounter: Payer: Self-pay | Admitting: Women's Health

## 2017-10-14 ENCOUNTER — Encounter: Payer: Self-pay | Admitting: Neurology

## 2017-10-14 ENCOUNTER — Encounter: Payer: Self-pay | Admitting: *Deleted

## 2017-10-14 ENCOUNTER — Encounter: Payer: Self-pay | Admitting: Women's Health

## 2017-10-14 ENCOUNTER — Telehealth: Payer: Self-pay | Admitting: *Deleted

## 2017-10-14 DIAGNOSIS — F4541 Pain disorder exclusively related to psychological factors: Secondary | ICD-10-CM

## 2017-10-14 NOTE — Telephone Encounter (Signed)
-----   Message from Harrington ChallengerNancy J Young, NP sent at 10/13/2017  2:46 PM EST ----- Having debilitating stress headaches that are causing nausea, needing to take 3 Excedrin for any relief, poor sleep. New onset but relates to stress? She would like to see someone as  Quickly as possible ,does Dr. Vela ProseLeWitt have any appointments in the near future? She is a Runner, broadcasting/film/videoteacher. Let me know if wait > 2 weeks

## 2017-10-14 NOTE — Telephone Encounter (Signed)
Dr.Lewitt # is no longer in service, I placed referral for [facility] they will call pt to schedule. I sent patient my chart message informing her of this as well.

## 2017-10-16 NOTE — Telephone Encounter (Signed)
Pt scheduled on 01/28/18 2 10:00am

## 2017-10-21 ENCOUNTER — Other Ambulatory Visit: Payer: Self-pay | Admitting: Women's Health

## 2017-10-21 MED ORDER — SUMATRIPTAN SUCCINATE 50 MG PO TABS: 50.0000 mg | ORAL_TABLET | ORAL | 3 refills | Status: DC | PRN

## 2017-11-11 ENCOUNTER — Other Ambulatory Visit: Payer: Self-pay | Admitting: Women's Health

## 2017-11-11 ENCOUNTER — Encounter: Payer: Self-pay | Admitting: Women's Health

## 2017-11-11 DIAGNOSIS — N912 Amenorrhea, unspecified: Secondary | ICD-10-CM

## 2017-11-11 DIAGNOSIS — N951 Menopausal and female climacteric states: Secondary | ICD-10-CM

## 2017-11-12 ENCOUNTER — Other Ambulatory Visit: Payer: BC Managed Care – PPO

## 2017-11-12 DIAGNOSIS — N912 Amenorrhea, unspecified: Secondary | ICD-10-CM

## 2017-11-13 ENCOUNTER — Encounter: Payer: Self-pay | Admitting: Women's Health

## 2017-11-13 LAB — FOLLICLE STIMULATING HORMONE: FSH: 63.4 m[IU]/mL

## 2017-12-24 ENCOUNTER — Other Ambulatory Visit: Payer: Self-pay | Admitting: Women's Health

## 2017-12-24 DIAGNOSIS — Z139 Encounter for screening, unspecified: Secondary | ICD-10-CM

## 2018-01-14 ENCOUNTER — Encounter: Payer: Self-pay | Admitting: Women's Health

## 2018-01-14 ENCOUNTER — Ambulatory Visit: Payer: BC Managed Care – PPO | Admitting: Women's Health

## 2018-01-14 VITALS — BP 122/72 | Ht 63.5 in | Wt 188.4 lb

## 2018-01-14 DIAGNOSIS — Z13220 Encounter for screening for lipoid disorders: Secondary | ICD-10-CM | POA: Diagnosis not present

## 2018-01-14 DIAGNOSIS — Z3041 Encounter for surveillance of contraceptive pills: Secondary | ICD-10-CM | POA: Diagnosis not present

## 2018-01-14 DIAGNOSIS — Z01419 Encounter for gynecological examination (general) (routine) without abnormal findings: Secondary | ICD-10-CM

## 2018-01-14 MED ORDER — NORETHIN ACE-ETH ESTRAD-FE 1-20 MG-MCG PO TABS: 1.0000 | ORAL_TABLET | Freq: Every day | ORAL | 4 refills | Status: DC

## 2018-01-14 NOTE — Progress Notes (Signed)
Sandra York 03/31/1976 [id number]    History:    Presents for annual exam.  Light monthly cycle on Loestrin 120 good relief of perimenopausal symptoms.  Not sexually active, denies need for STD screen.  Normal Pap and mammogram history.  Past medical history, past surgical history, family history and social history were all reviewed and documented in the EPIC chart.  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  ROS:  A ROS was performed and pertinent positives and negatives are included.  Exam:  Vitals:   01/14/18 1357  BP: 122/72  Weight: 188 lb 6.4 oz (85.5 kg)  Height: 5' 3.5" (1.613 m)   Body mass index is 32.85 kg/m.   General appearance:  Normal Thyroid:  Symmetrical, normal in size, without palpable masses or nodularity. Respiratory  Auscultation:  Clear without wheezing or rhonchi Cardiovascular  Auscultation:  Regular rate, without rubs, murmurs or gallops  Edema/varicosities:  Not grossly evident Abdominal  Soft,nontender, without masses, guarding or rebound.  Liver/spleen:  No organomegaly noted  Hernia:  None appreciated  Skin  Inspection:  Grossly normal   Breasts: Examined lying and sitting.     Right: Without masses, retractions, discharge or axillary adenopathy.     Left: Without masses, retractions, discharge or axillary adenopathy. Gentitourinary   Inguinal/mons:  Normal without inguinal adenopathy  External genitalia:  Normal  BUS/Urethra/Skene's glands:  Normal  Vagina:  Normal  Cervix:  Normal  Uterus:  normal in size, shape and contour.  Midline and mobile  Adnexa/parametria:     Rt: Without masses or tenderness.   Lt: Without masses or tenderness.  Anus and perineum: Normal  Digital rectal exam: Normal sphincter tone without palpated masses or tenderness  Assessment/Plan:  42 y.o. S WF G0 for annual exam with no complaints.  Light monthly cycle on Loestrin Obesity  Plan: Loestrin 1/20 prescription,  proper use, slight risk for blood clots and strokes reviewed.  Will continue on the Loestrin 1/20, FSH elevated, will decrease to the lo Loestrin next year.  Continue weight watchers is down 12 pounds, calcium rich foods, vitamin D 2000 daily encouraged.  Reviewed importance of continuing healthy diet and regular exercise.  SBE's, continue annual screening mammogram.  CBC, CMP, will check lipid panel next year.  Pap normal 2018, new screening guidelines reviewed.  Harrington Challengerancy J Raymonde Hamblin [facility] LLCWHNP, 2:07 PM 01/14/2018

## 2018-01-14 NOTE — Patient Instructions (Signed)

## 2018-01-19 ENCOUNTER — Encounter: Payer: BC Managed Care – PPO | Admitting: Women's Health

## 2018-01-28 ENCOUNTER — Encounter

## 2018-01-28 ENCOUNTER — Ambulatory Visit: Payer: BC Managed Care – PPO | Admitting: Neurology

## 2018-02-02 ENCOUNTER — Ambulatory Visit: Payer: BC Managed Care – PPO

## 2018-02-02 ENCOUNTER — Ambulatory Visit
Admission: RE | Admit: 2018-02-02 | Discharge: 2018-02-02 | Disposition: A | Discharge status: Home / Self Care | Payer: BC Managed Care – PPO | Source: Ambulatory Visit | Attending: Women's Health | Admitting: Women's Health

## 2018-02-02 DIAGNOSIS — Z139 Encounter for screening, unspecified: Secondary | ICD-10-CM

## 2018-02-03 ENCOUNTER — Encounter (INDEPENDENT_AMBULATORY_CARE_PROVIDER_SITE_OTHER): Payer: Self-pay

## 2018-12-21 ENCOUNTER — Other Ambulatory Visit: Payer: Self-pay | Admitting: Women's Health

## 2018-12-21 DIAGNOSIS — Z1231 Encounter for screening mammogram for malignant neoplasm of breast: Secondary | ICD-10-CM

## 2019-01-27 ENCOUNTER — Encounter: Payer: BC Managed Care – PPO | Admitting: Women's Health

## 2019-02-02 ENCOUNTER — Other Ambulatory Visit: Payer: Self-pay

## 2019-02-03 ENCOUNTER — Ambulatory Visit: Payer: BC Managed Care – PPO | Admitting: Women's Health

## 2019-02-03 ENCOUNTER — Encounter: Payer: Self-pay | Admitting: Women's Health

## 2019-02-03 VITALS — BP 122/80 | Ht 63.0 in | Wt 149.0 lb

## 2019-02-03 DIAGNOSIS — Z01419 Encounter for gynecological examination (general) (routine) without abnormal findings: Secondary | ICD-10-CM

## 2019-02-03 DIAGNOSIS — Z3041 Encounter for surveillance of contraceptive pills: Secondary | ICD-10-CM

## 2019-02-03 DIAGNOSIS — Z13220 Encounter for screening for lipoid disorders: Secondary | ICD-10-CM | POA: Diagnosis not present

## 2019-02-03 MED ORDER — NORETHIN ACE-ETH ESTRAD-FE 1-20 MG-MCG PO TABS: 1.0000 | ORAL_TABLET | Freq: Every day | ORAL | 4 refills | Status: DC

## 2019-02-03 NOTE — Progress Notes (Signed)
Sandra York 18-May-1976 [id number]    History:    Presents for annual exam.  Light to no cycle on Loestrin 1/20.  Normal Pap and mammogram history.  Not sexually active in years.  Denies need for STD screen.  Is down 50 pounds with weight watchers and states feels great and GERD is completely resolved.  Past medical history, past surgical history, family history and social history were all reviewed and documented in the EPIC chart.  Special needs teacher.  Parents healthy.  Knits.  ROS:  A ROS was performed and pertinent positives and negatives are included.  Exam:  Vitals:   02/03/19 1030  BP: 122/80  Weight: 149 lb (67.6 kg)  Height: 5\' 3"  (1.6 m)   Body mass index is 26.39 kg/m.   General appearance:  Normal Thyroid:  Symmetrical, normal in size, without palpable masses or nodularity. Respiratory  Auscultation:  Clear without wheezing or rhonchi Cardiovascular  Auscultation:  Regular rate, without rubs, murmurs or gallops  Edema/varicosities:  Not grossly evident Abdominal  Soft,nontender, without masses, guarding or rebound.  Liver/spleen:  No organomegaly noted  Hernia:  None appreciated  Skin  Inspection:  Grossly normal   Breasts: Examined lying and sitting.     Right: Without masses, retractions, discharge or axillary adenopathy.     Left: Without masses, retractions, discharge or axillary adenopathy. Gentitourinary   Inguinal/mons:  Normal without inguinal adenopathy  External genitalia:  Normal  BUS/Urethra/Skene's glands:  Normal  Vagina:  Normal  Cervix:  Normal  Uterus:   normal in size, shape and contour.  Midline and mobile  Adnexa/parametria:     Rt: Without masses or tenderness.   Lt: Without masses or tenderness.  Anus and perineum: Normal  Digital rectal exam: Normal sphincter tone without palpated masses or tenderness  Assessment/Plan:  43 y.o. S WF G0 for annual exam with no complaints.  Light or no cycle on Loestrin 1/20  Pain: Loestrin  1/20 prescription, proper use, slight risk for blood clots and strokes.  SBEs, continue annual screening mammogram has scheduled.  Continue active lifestyle of regular exercise, healthy diet, congratulations given on 50 pound weight loss.  CBC, CMP, lipid panel, Pap normal 2018, new screening guidelines reviewed.    Har[facility], 10:51 AM 02/03/2019

## 2019-02-03 NOTE — Patient Instructions (Signed)

## 2019-02-04 ENCOUNTER — Ambulatory Visit: Payer: BC Managed Care – PPO

## 2019-03-08 ENCOUNTER — Ambulatory Visit: Payer: BC Managed Care – PPO

## 2019-04-04 ENCOUNTER — Other Ambulatory Visit: Payer: Self-pay | Admitting: Women's Health

## 2019-04-04 DIAGNOSIS — Z3041 Encounter for surveillance of contraceptive pills: Secondary | ICD-10-CM

## 2019-04-05 ENCOUNTER — Other Ambulatory Visit: Payer: Self-pay | Admitting: Women's Health

## 2019-04-05 DIAGNOSIS — Z3041 Encounter for surveillance of contraceptive pills: Secondary | ICD-10-CM

## 2019-05-03 ENCOUNTER — Ambulatory Visit
Admission: RE | Admit: 2019-05-03 | Discharge: 2019-05-03 | Disposition: A | Discharge status: Home / Self Care | Payer: BC Managed Care – PPO | Source: Ambulatory Visit | Attending: Women's Health | Admitting: Women's Health

## 2019-05-03 ENCOUNTER — Other Ambulatory Visit: Payer: Self-pay

## 2019-05-03 DIAGNOSIS — Z1231 Encounter for screening mammogram for malignant neoplasm of breast: Secondary | ICD-10-CM

## 2019-05-04 ENCOUNTER — Other Ambulatory Visit: Payer: Self-pay | Admitting: Women's Health

## 2019-05-04 DIAGNOSIS — R928 Other abnormal and inconclusive findings on diagnostic imaging of breast: Secondary | ICD-10-CM

## 2019-05-10 ENCOUNTER — Ambulatory Visit
Admission: RE | Admit: 2019-05-10 | Discharge: 2019-05-10 | Disposition: A | Discharge status: Home / Self Care | Payer: BC Managed Care – PPO | Source: Ambulatory Visit | Attending: Women's Health | Admitting: Women's Health

## 2019-05-10 ENCOUNTER — Encounter: Payer: Self-pay | Admitting: Women's Health

## 2019-05-10 ENCOUNTER — Other Ambulatory Visit: Payer: Self-pay

## 2019-05-10 DIAGNOSIS — R928 Other abnormal and inconclusive findings on diagnostic imaging of breast: Secondary | ICD-10-CM

## 2019-09-10 ENCOUNTER — Other Ambulatory Visit: Payer: Self-pay

## 2019-09-10 DIAGNOSIS — Z20822 Contact with and (suspected) exposure to covid-19: Secondary | ICD-10-CM

## 2019-09-11 LAB — NOVEL CORONAVIRUS, NAA: SARS-CoV-2, NAA: NOT DETECTED

## 2019-11-18 ENCOUNTER — Ambulatory Visit: Payer: BC Managed Care – PPO

## 2019-11-22 ENCOUNTER — Ambulatory Visit: Payer: BC Managed Care – PPO | Attending: Family

## 2019-11-22 DIAGNOSIS — Z23 Encounter for immunization: Secondary | ICD-10-CM | POA: Insufficient documentation

## 2019-11-22 NOTE — Progress Notes (Signed)
   Covid-19 Vaccination Clinic  Name:  Sandra York    MRN: [id number] DOB: 12-Jun-1976  11/22/2019  [name] was observed post Covid-19 immunization for 15 minutes without incidence. She was provided with Vaccine Information Sheet and instruction to access the V-Safe system.   [name] was instructed to call 911 with any severe reactions post vaccine: Marland Kitchen Difficulty breathing  . Swelling of your face and throat  . A fast heartbeat  . A bad rash all over your body  . Dizziness and weakness    Immunizations Administered    Name Date Dose VIS Date Route   Moderna COVID-19 Vaccine 11/22/2019  1:40 PM 0.5 mL 08/31/2019 Intramuscular   Manufacturer: Moderna   Lot: [id number]   NDC: 86148-307-35

## 2019-12-28 ENCOUNTER — Ambulatory Visit: Payer: BC Managed Care – PPO | Attending: Family

## 2019-12-28 DIAGNOSIS — Z23 Encounter for immunization: Secondary | ICD-10-CM

## 2019-12-28 NOTE — Progress Notes (Signed)
   Covid-19 Vaccination Clinic  Name:  Sandra York    MRN: [id number] DOB: Jul 08, 1976  12/28/2019  [name] was observed post Covid-19 immunization for 15 minutes without incident. She was provided with Vaccine Information Sheet and instruction to access the V-Safe system.   [name] was instructed to call 911 with any severe reactions post vaccine: Marland Kitchen Difficulty breathing  . Swelling of face and throat  . A fast heartbeat  . A bad rash all over body  . Dizziness and weakness   Immunizations Administered    Name Date Dose VIS Date Route   Moderna COVID-19 Vaccine 12/28/2019  1:10 PM 0.5 mL 08/31/2019 Intramuscular   Manufacturer: Moderna   Lot: [id number]   NDC: 50354-656-81

## 2020-02-09 ENCOUNTER — Encounter: Payer: BC Managed Care – PPO | Admitting: Women's Health

## 2020-03-06 ENCOUNTER — Other Ambulatory Visit: Payer: Self-pay

## 2020-03-06 DIAGNOSIS — Z3041 Encounter for surveillance of contraceptive pills: Secondary | ICD-10-CM

## 2020-03-07 ENCOUNTER — Encounter: Payer: BC Managed Care – PPO | Admitting: Nurse Practitioner

## 2020-03-27 ENCOUNTER — Other Ambulatory Visit: Payer: Self-pay | Admitting: Obstetrics and Gynecology

## 2020-03-27 DIAGNOSIS — Z1231 Encounter for screening mammogram for malignant neoplasm of breast: Secondary | ICD-10-CM

## 2020-04-10 ENCOUNTER — Ambulatory Visit
Admission: RE | Admit: 2020-04-10 | Discharge: 2020-04-10 | Disposition: A | Discharge status: Home / Self Care | Payer: BC Managed Care – PPO | Source: Ambulatory Visit | Attending: Obstetrics and Gynecology | Admitting: Obstetrics and Gynecology

## 2020-04-10 ENCOUNTER — Other Ambulatory Visit: Payer: Self-pay

## 2020-04-10 DIAGNOSIS — Z1231 Encounter for screening mammogram for malignant neoplasm of breast: Secondary | ICD-10-CM | POA: Diagnosis not present

## 2021-05-28 ENCOUNTER — Other Ambulatory Visit: Payer: Self-pay | Admitting: Obstetrics and Gynecology

## 2021-05-28 DIAGNOSIS — Z1231 Encounter for screening mammogram for malignant neoplasm of breast: Secondary | ICD-10-CM

## 2021-06-20 ENCOUNTER — Other Ambulatory Visit: Payer: Self-pay

## 2021-06-20 ENCOUNTER — Ambulatory Visit
Admission: RE | Admit: 2021-06-20 | Discharge: 2021-06-20 | Disposition: A | Discharge status: Home / Self Care | Payer: BC Managed Care – PPO | Source: Ambulatory Visit | Attending: Obstetrics and Gynecology | Admitting: Obstetrics and Gynecology

## 2021-06-20 DIAGNOSIS — Z1231 Encounter for screening mammogram for malignant neoplasm of breast: Secondary | ICD-10-CM | POA: Insufficient documentation

## 2021-06-28 ENCOUNTER — Other Ambulatory Visit: Payer: Self-pay | Admitting: Obstetrics and Gynecology

## 2021-06-28 DIAGNOSIS — R921 Mammographic calcification found on diagnostic imaging of breast: Secondary | ICD-10-CM

## 2021-06-28 DIAGNOSIS — R928 Other abnormal and inconclusive findings on diagnostic imaging of breast: Secondary | ICD-10-CM

## 2021-07-23 ENCOUNTER — Other Ambulatory Visit: Payer: Self-pay

## 2021-07-23 ENCOUNTER — Ambulatory Visit
Admission: RE | Admit: 2021-07-23 | Discharge: 2021-07-23 | Disposition: A | Discharge status: Home / Self Care | Payer: BC Managed Care – PPO | Source: Ambulatory Visit | Attending: Obstetrics and Gynecology | Admitting: Obstetrics and Gynecology

## 2021-07-23 DIAGNOSIS — R928 Other abnormal and inconclusive findings on diagnostic imaging of breast: Secondary | ICD-10-CM | POA: Diagnosis not present

## 2021-07-23 DIAGNOSIS — R921 Mammographic calcification found on diagnostic imaging of breast: Secondary | ICD-10-CM | POA: Insufficient documentation

## 2021-07-25 ENCOUNTER — Other Ambulatory Visit: Payer: Self-pay | Admitting: Obstetrics and Gynecology

## 2021-07-25 DIAGNOSIS — R921 Mammographic calcification found on diagnostic imaging of breast: Secondary | ICD-10-CM

## 2021-07-25 DIAGNOSIS — R928 Other abnormal and inconclusive findings on diagnostic imaging of breast: Secondary | ICD-10-CM

## 2021-09-05 ENCOUNTER — Ambulatory Visit
Admission: RE | Admit: 2021-09-05 | Discharge: 2021-09-05 | Disposition: A | Discharge status: Home / Self Care | Payer: BC Managed Care – PPO | Source: Ambulatory Visit | Attending: Obstetrics and Gynecology | Admitting: Obstetrics and Gynecology

## 2021-09-05 ENCOUNTER — Other Ambulatory Visit: Payer: Self-pay

## 2021-09-05 DIAGNOSIS — R921 Mammographic calcification found on diagnostic imaging of breast: Secondary | ICD-10-CM

## 2021-09-05 DIAGNOSIS — R928 Other abnormal and inconclusive findings on diagnostic imaging of breast: Secondary | ICD-10-CM

## 2021-09-05 HISTORY — PX: BREAST BIOPSY: SHX20

## 2021-09-06 ENCOUNTER — Encounter: Payer: Self-pay | Admitting: *Deleted

## 2021-09-06 LAB — SURGICAL PATHOLOGY

## 2021-09-06 NOTE — Progress Notes (Signed)
Received message from Collene Mares, RN at [address] [facility] that patient had been notified of her biopsy results and need for surgical consult.  I have called the patient and she would like to see a Careers adviser at [facility].  Appointment scheduled to see Dr. Tonna Boehringer on 09/17/21 @ 9:00.

## 2021-09-17 ENCOUNTER — Ambulatory Visit: Payer: Self-pay | Admitting: Surgery

## 2021-09-17 ENCOUNTER — Other Ambulatory Visit: Payer: Self-pay | Admitting: Surgery

## 2021-09-17 DIAGNOSIS — N6099 Unspecified benign mammary dysplasia of unspecified breast: Secondary | ICD-10-CM

## 2021-09-17 NOTE — H&P (Signed)
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 female who was referred by Rebecca Anne McVey, CNM for evaluation of above. Change was noted on last screening mammogram. Patient does routinely do self breast exams. Patient has not noted a change on breast exam. Age of menarche was 10. Age of menopause was 43. Patient denies hormonal therapy. Patient is G0P0.Patient denies nipple discharge. Patient denies previous breast biopsy. Patient denies a personal history of breast cancer. ° °Past Medical History: has a past medical history of Contact dermatitis, GERD (gastroesophageal reflux disease), H/O myringotomy, Obesity, Pure hypercholesterolemia, and Seasonal allergic rhinitis. ° °Past Surgical History: has a past surgical history that includes Appendectomy and Tonsillectomy. ° °Family History: family history includes Other in her mother and paternal aunt. ° °Social History: reports that she has never smoked. She has never used smokeless tobacco. She reports current alcohol use. She reports that she does not use drugs. ° °Current Medications: has a current medication list which includes the following prescription(s): aspirin/acetaminophen/caffeine, fluticasone propionate, ipratropium, multivitamin, and norethindrone-ethinyl estradiol. ° °Allergies:  °Allergies as of 09/17/2021  ° (No Known Allergies)  ° °ROS:  °A 15 point review of systems was performed and was negative except as noted in HPI ° °Objective:  ° ° °BP 103/70   Pulse 84   Ht 160 cm (5' 3")   Wt 73.9 kg (163 lb)   BMI 28.87 kg/m²  ° °Constitutional : No distress, cooperative, alert  °Lymphatics/Throat: Supple with no lymphadenopathy  °Respiratory: Clear to auscultation bilaterally  °Cardiovascular: Regular rate and rhythm  °Gastrointestinal: Soft, non-tender, non-distended, no organomegaly.  °Musculoskeletal: Steady gait and movement  °Skin: Cool and moist, no surgical scars  °Psychiatric: Normal affect,  non-agitated, not confused  °Breast: Normal appearance and no palpable pathology bilaterally. Chaperone present for exam  ° ° °LABS:  °SURGICAL PATHOLOGY SURGICAL PATHOLOGY  °CASE: [id number]  °PATIENT: Sandra York  °Surgical Pathology Report  ° °Specimen Submitted:  °A. Breast, left  ° °Clinical History: Indeterminate calcifications.  FCC vs fibroadenoma vs  °adenosis vs DCIS. X-shaped clip placed following stereotactic biopsy of  °LEFT breast, upper outer quadrant.  ° °DIAGNOSIS:  °A. BREAST, LEFT UPPER OUTER QUADRANT; STEREOTACTIC CORE NEEDLE BIOPSY:  °- ATYPICAL DUCTAL HYPERPLASIA.  °- BACKGROUND FIBROCYSTIC CHANGES AND COLUMNAR CELL CHANGE WITHOUT  °ATYPIA, WITH ASSOCIATED MICROCALCIFICATIONS.  °- NEGATIVE FOR DUCTAL CARCINOMA IN SITU AND MALIGNANCY.  ° °Comment:  °Atypical ductal hyperplasia is present in 2 of 6 tissue blocks, spanning  °up to 2 mm in greatest linear extent.  ° °GROSS DESCRIPTION:  °A. Labeled: Left breast stereo biopsy upper outer calcs  °Received: in a formalin-filled Brevera collection device  °Specimen radiograph image(s) available for review  °Time/Date in fixative: Collected at 9:04 AM on 09/05/2021 and placed in  °formalin at 9:07 AM on 09/05/2021  °Cold ischemic time: Approximately 3 minutes  °Total fixation time: Approximately 8.2 hours  °Core pieces: Multiple  °Measurement: Aggregate, 3.4 x 1.5 x 0.7 cm  °Description / comments: Received are yellow, fibrofatty fragments of  °tissue.  A diagram is provided on the specimen container and sections B  °and D are checked.  °Inked: Green  °Entirely submitted in cassette(s):  ° °1-section B  °2-section D  °3-sections A and C  °4-sections E and F  °5-sections G and H  °6-sections I and J  ° °CH 09/05/2021  ° °Final Diagnosis performed by Heath Jones, MD.   Electronically signed  °  Final Diagnosis performed by Katherine Mantle, MD.   Electronically signed  09/06/2021 9:48:45AM  The electronic signature indicates that the named Attending Pathologist  has evaluated the specimen  Technical component  performed at [facility], [address] [place],  [place] [address] Lab: [phone] Dir: Jolene Schimke, MD, MMM   Professional component performed at [facility], [facility], [address] [place], [place], [place] [address] Lab: [phone]  Dir: Beryle Quant, MD     RADS: CLINICAL DATA:  Patient was recalled from screening mammogram for  left breast calcifications.   EXAM:  DIGITAL DIAGNOSTIC UNILATERAL LEFT MAMMOGRAM WITH TOMOSYNTHESIS AND  CAD   TECHNIQUE:  Left digital diagnostic mammography and breast tomosynthesis was  performed. The images were evaluated with computer-aided detection.   COMPARISON:  Previous exam(s).   ACR Breast Density Category c: The breast tissue is heterogeneously  dense, which may obscure small masses.   FINDINGS:  Additional imaging of the left breast shows persistence of  developing grouped calcifications spanning an area of 3 mm in the  lateral aspect the breast best seen on the cc view. There is no  suspicious mass.   IMPRESSION:  Indeterminate calcifications in the lateral aspect of the left  breast.   RECOMMENDATION:  Stereotactic biopsy of the calcifications in the lateral aspect of  the left breast is recommended.   I have discussed the findings and recommendations with the patient.  If applicable, a reminder letter will be sent to the patient  regarding the next appointment.   BI-RADS CATEGORY  4: Suspicious.    ADDENDUM:  Pathology revealed ATYPICAL DUCTAL HYPERPLASIA - BACKGROUND  FIBROCYSTIC CHANGES AND COLUMNAR CELL CHANGE WITHOUT   ATYPIA, WITH ASSOCIATED MICROCALCIFICATIONS of the LEFT breast,  upper outer quadrant (X clip). This was found to be concordant by  Dr. Bary Richard, with surgical consultation for excision  recommended.   Pathology results were discussed with the patient by telephone. The  patient reported doing well after the biopsy with tenderness at the  site. Post biopsy instructions and  care were reviewed and questions  were answered. The patient was encouraged to call [facility] for any additional  concerns.   Reports and recommendations sent via Epic messaging to Coralee Rud  RN, and Molli Posey RN, Oncology Nurse Navigators at [facility] on 09/06/2021 for surgical referral.   Pathology results reported by Collene Mares RN on 09/06/2021.      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 The risks of general anesthetic, if used, includes MI, CVA, sudden death or even reaction to anesthetic medications also discussed.  Typical post-op recovery time and possbility of activity restrictions were also discussed.  Alternatives include continued observation.  Benefits include possible symptom relief, pathologic evaluation, and/or curative excision.   The patient verbalized understanding and all questions were answered to the patient's satisfaction.  2. Patient has elected to proceed with surgical treatment. Procedure will be scheduled.  Lumpectomy with RF tag placement. LEFT

## 2021-10-12 ENCOUNTER — Other Ambulatory Visit: Payer: BC Managed Care – PPO

## 2021-10-16 ENCOUNTER — Encounter
Admission: RE | Admit: 2021-10-16 | Discharge: 2021-10-16 | Disposition: A | Discharge status: Home / Self Care | Payer: BC Managed Care – PPO | Source: Ambulatory Visit | Attending: Surgery | Admitting: Surgery

## 2021-10-16 ENCOUNTER — Other Ambulatory Visit: Payer: Self-pay

## 2021-10-16 NOTE — Patient Instructions (Addendum)
Your procedure is scheduled on: 1/26.2023  Report to the Registration Desk on the 1st floor of the Medical Mall. To find out your arrival time, please call [phone] between 1PM - 3PM on: 10/24/2021   REMEMBER: Instructions that are not followed completely may result in serious medical risk, up to and including death; or upon the discretion of your surgeon and anesthesiologist your surgery may need to be rescheduled.  Do not eat food after midnight the night before surgery.  No gum chewing, lozengers or hard candies.  You may however, drink CLEAR liquids up to 2 hours before you are scheduled to arrive for your surgery. Do not drink anything within 2 hours of your scheduled arrival time.  Clear liquids include: - water  - apple juice without pulp - gatorade (not RED, PURPLE, OR BLUE) - black coffee or tea (Do NOT add milk or creamers to the coffee or tea) Do NOT drink anything that is not on this list.   TAKE THESE MEDICATIONS THE MORNING OF SURGERY WITH A SIP OF WATER: None   One week prior to surgery: Stop Anti-inflammatories (NSAIDS) such as Advil, Aleve, Ibuprofen, Motrin, Naproxen, Naprosyn and Aspirin based products such as Excedrin, Goodys Powder, BC Powder. Stop ANY OVER THE COUNTER supplements until after surgery like multivitamin and for neuro balance. You may however, continue to take Tylenol if needed for pain up until the day of surgery.  No Alcohol for 24 hours before or after surgery.  No Smoking including e-cigarettes for 24 hours prior to surgery.  No chewable tobacco products for at least 6 hours prior to surgery.  No nicotine patches on the day of surgery.  Do not use any "recreational" drugs for at least a week prior to your surgery.  Please be advised that the combination of cocaine and anesthesia may have negative outcomes, up to and including death. If you test positive for cocaine, your surgery will be cancelled.  On the morning of surgery brush your  teeth with toothpaste and water, you may rinse your mouth with mouthwash if you wish. Do not swallow any toothpaste or mouthwash.  Use CHG Soap or wipes as directed on instruction sheet.  Do not wear jewelry, make-up, hairpins, clips or nail polish.  Do not wear lotions, powders, perfumes, creams, ointment and deodorant.  Do not shave body from the neck down 48 hours prior to surgery just in case you cut yourself which could leave a site for infection.  Also, freshly shaved skin may become irritated if using the CHG soap.  Contact lenses, hearing aids and dentures may not be worn into surgery.  Do not bring valuables to the hospital. [facility] is not responsible for any missing/lost belongings or valuables.   Notify your doctor if there is any change in your medical condition (cold, fever, infection).  Wear comfortable clothing (specific to your surgery type) to the hospital.  After surgery, you can help prevent lung complications by doing breathing exercises.  Take deep breaths and cough every 1-2 hours. Your doctor may order a device called an Incentive Spirometer to help you take deep breaths. If you are being admitted to the hospital overnight, leave your suitcase in the car. After surgery it may be brought to your room.  If you are being discharged the day of surgery, you will not be allowed to drive home. You will need a responsible adult (18 years or older) to drive you home and stay with you that night.  If you are taking public transportation, you will need to have a responsible adult (18 years or older) with you. Please confirm with your physician that it is acceptable to use public transportation.   Please call the Pre-admissions Testing Dept. at [phone] if you have any questions about these instructions.  Surgery Visitation Policy:  Patients undergoing a surgery or procedure may have one family member or support person with them as long as that person is not  COVID-19 positive or experiencing its symptoms.  That person may remain in the waiting area during the procedure and may rotate out with other people.

## 2021-10-17 ENCOUNTER — Ambulatory Visit
Admission: RE | Admit: 2021-10-17 | Discharge: 2021-10-17 | Disposition: A | Discharge status: Home / Self Care | Payer: BC Managed Care – PPO | Source: Ambulatory Visit | Attending: Surgery | Admitting: Surgery

## 2021-10-17 DIAGNOSIS — N6099 Unspecified benign mammary dysplasia of unspecified breast: Secondary | ICD-10-CM | POA: Diagnosis present

## 2021-10-25 ENCOUNTER — Encounter
Admission: RE | Disposition: A | Discharge status: Home / Self Care | Payer: Self-pay | Source: Home / Self Care | Attending: Surgery

## 2021-10-25 ENCOUNTER — Ambulatory Visit
Admission: RE | Admit: 2021-10-25 | Discharge: 2021-10-25 | Disposition: A | Discharge status: Home / Self Care | Payer: BC Managed Care – PPO | Source: Ambulatory Visit | Attending: Surgery | Admitting: Surgery

## 2021-10-25 ENCOUNTER — Ambulatory Visit: Payer: BC Managed Care – PPO | Admitting: Certified Registered"

## 2021-10-25 ENCOUNTER — Other Ambulatory Visit: Payer: Self-pay

## 2021-10-25 ENCOUNTER — Encounter: Payer: Self-pay | Admitting: Surgery

## 2021-10-25 ENCOUNTER — Ambulatory Visit
Admission: RE | Admit: 2021-10-25 | Discharge: 2021-10-25 | Disposition: A | Discharge status: Home / Self Care | Payer: BC Managed Care – PPO | Attending: Surgery | Admitting: Surgery

## 2021-10-25 DIAGNOSIS — N6099 Unspecified benign mammary dysplasia of unspecified breast: Secondary | ICD-10-CM

## 2021-10-25 DIAGNOSIS — N6092 Unspecified benign mammary dysplasia of left breast: Secondary | ICD-10-CM | POA: Insufficient documentation

## 2021-10-25 HISTORY — PX: BREAST EXCISIONAL BIOPSY: SUR124

## 2021-10-25 HISTORY — PX: BREAST LUMPECTOMY WITH RADIO FREQUENCY LOCALIZER: SHX6897

## 2021-10-25 LAB — POCT PREGNANCY, URINE: Preg Test, Ur: NEGATIVE

## 2021-10-25 SURGERY — BREAST LUMPECTOMY WITH RADIO FREQUENCY LOCALIZER
Anesthesia: General | Site: Breast | Laterality: Left

## 2021-10-25 MED ORDER — IBUPROFEN 800 MG PO TABS: 800.0000 mg | ORAL_TABLET | Freq: Three times a day (TID) | ORAL | 0 refills | Status: AC | PRN

## 2021-10-25 MED ORDER — FENTANYL CITRATE (PF) 100 MCG/2ML IJ SOLN
INTRAMUSCULAR | Status: AC
  Filled 2021-10-25: qty 2

## 2021-10-25 MED ORDER — EPHEDRINE SULFATE (PRESSORS) 50 MG/ML IJ SOLN
INTRAMUSCULAR | Status: DC | PRN
  Administered 2021-10-25 (×3): 5 mg via INTRAVENOUS

## 2021-10-25 MED ORDER — MIDAZOLAM HCL 2 MG/2ML IJ SOLN
INTRAMUSCULAR | Status: AC
  Filled 2021-10-25: qty 2

## 2021-10-25 MED ORDER — KETOROLAC TROMETHAMINE 30 MG/ML IJ SOLN
INTRAMUSCULAR | Status: DC | PRN
  Administered 2021-10-25: 30 mg via INTRAVENOUS

## 2021-10-25 MED ORDER — ACETAMINOPHEN 10 MG/ML IV SOLN
INTRAVENOUS | Status: DC | PRN
  Administered 2021-10-25: 1000 mg via INTRAVENOUS

## 2021-10-25 MED ORDER — CHLORHEXIDINE GLUCONATE 0.12 % MT SOLN
OROMUCOSAL | Status: AC
  Filled 2021-10-25: qty 15

## 2021-10-25 MED ORDER — FENTANYL CITRATE (PF) 100 MCG/2ML IJ SOLN: 25.0000 ug | INTRAMUSCULAR | Status: DC | PRN

## 2021-10-25 MED ORDER — LIDOCAINE HCL (PF) 1 % IJ SOLN
INTRAMUSCULAR | Status: AC
  Filled 2021-10-25: qty 30

## 2021-10-25 MED ORDER — ORAL CARE MOUTH RINSE: 15.0000 mL | Freq: Once | OROMUCOSAL | Status: AC

## 2021-10-25 MED ORDER — MIDAZOLAM HCL 2 MG/2ML IJ SOLN
INTRAMUSCULAR | Status: DC | PRN
  Administered 2021-10-25: 2 mg via INTRAVENOUS

## 2021-10-25 MED ORDER — SUCCINYLCHOLINE CHLORIDE 200 MG/10ML IV SOSY
PREFILLED_SYRINGE | INTRAVENOUS | Status: DC | PRN
  Administered 2021-10-25: 100 mg via INTRAVENOUS

## 2021-10-25 MED ORDER — BUPIVACAINE-EPINEPHRINE (PF) 0.5% -1:200000 IJ SOLN
INTRAMUSCULAR | Status: AC
  Filled 2021-10-25: qty 30

## 2021-10-25 MED ORDER — LACTATED RINGERS IV SOLN: INTRAVENOUS | Status: DC

## 2021-10-25 MED ORDER — FENTANYL CITRATE (PF) 100 MCG/2ML IJ SOLN
INTRAMUSCULAR | Status: DC | PRN
  Administered 2021-10-25 (×2): 50 ug via INTRAVENOUS

## 2021-10-25 MED ORDER — ROCURONIUM BROMIDE 100 MG/10ML IV SOLN
INTRAVENOUS | Status: DC | PRN
  Administered 2021-10-25: 40 mg via INTRAVENOUS
  Administered 2021-10-25: 10 mg via INTRAVENOUS

## 2021-10-25 MED ORDER — DEXAMETHASONE SODIUM PHOSPHATE 10 MG/ML IJ SOLN
INTRAMUSCULAR | Status: DC | PRN
  Administered 2021-10-25: 10 mg via INTRAVENOUS

## 2021-10-25 MED ORDER — SUGAMMADEX SODIUM 200 MG/2ML IV SOLN
INTRAVENOUS | Status: DC | PRN
  Administered 2021-10-25: 200 mg via INTRAVENOUS

## 2021-10-25 MED ORDER — ACETAMINOPHEN 10 MG/ML IV SOLN
INTRAVENOUS | Status: AC
  Filled 2021-10-25: qty 100

## 2021-10-25 MED ORDER — PROMETHAZINE HCL 25 MG/ML IJ SOLN: 6.2500 mg | INTRAMUSCULAR | Status: DC | PRN

## 2021-10-25 MED ORDER — CHLORHEXIDINE GLUCONATE 0.12 % MT SOLN
15.0000 mL | Freq: Once | OROMUCOSAL | Status: AC
  Administered 2021-10-25: 15 mL via OROMUCOSAL

## 2021-10-25 MED ORDER — ONDANSETRON HCL 4 MG/2ML IJ SOLN
INTRAMUSCULAR | Status: DC | PRN
Start: 2021-10-25 — End: 2021-10-25
  Administered 2021-10-25 (×2): 4 mg via INTRAVENOUS

## 2021-10-25 MED ORDER — LIDOCAINE HCL (CARDIAC) PF 100 MG/5ML IV SOSY
PREFILLED_SYRINGE | INTRAVENOUS | Status: DC | PRN
  Administered 2021-10-25: 100 mg via INTRAVENOUS

## 2021-10-25 MED ORDER — PROPOFOL 10 MG/ML IV BOLUS
INTRAVENOUS | Status: DC | PRN
Start: 2021-10-25 — End: 2021-10-25
  Administered 2021-10-25: 200 mg via INTRAVENOUS

## 2021-10-25 MED ORDER — CHLORHEXIDINE GLUCONATE CLOTH 2 % EX PADS
6.0000 | MEDICATED_PAD | Freq: Once | CUTANEOUS | Status: AC
  Administered 2021-10-25: 6 via TOPICAL

## 2021-10-25 MED ORDER — CEFAZOLIN SODIUM-DEXTROSE 2-4 GM/100ML-% IV SOLN
2.0000 g | INTRAVENOUS | Status: AC
  Administered 2021-10-25: 2 g via INTRAVENOUS

## 2021-10-25 MED ORDER — HYDROCODONE-ACETAMINOPHEN 5-325 MG PO TABS: 1.0000 | ORAL_TABLET | Freq: Four times a day (QID) | ORAL | 0 refills | Status: AC | PRN

## 2021-10-25 MED ORDER — GLYCOPYRROLATE 0.2 MG/ML IJ SOLN
INTRAMUSCULAR | Status: DC | PRN
  Administered 2021-10-25: .2 mg via INTRAVENOUS

## 2021-10-25 MED ORDER — FAMOTIDINE 20 MG PO TABS: 20.0000 mg | ORAL_TABLET | Freq: Once | ORAL | Status: DC

## 2021-10-25 MED ORDER — DOCUSATE SODIUM 100 MG PO CAPS: 100.0000 mg | ORAL_CAPSULE | Freq: Two times a day (BID) | ORAL | 0 refills | Status: AC | PRN

## 2021-10-25 MED ORDER — BUPIVACAINE-EPINEPHRINE 0.5% -1:200000 IJ SOLN
INTRAMUSCULAR | Status: DC | PRN
  Administered 2021-10-25: 5 mL

## 2021-10-25 MED ORDER — DEXMEDETOMIDINE HCL IN NACL 200 MCG/50ML IV SOLN
INTRAVENOUS | Status: DC | PRN
  Administered 2021-10-25: 12 ug via INTRAVENOUS
  Administered 2021-10-25: 8 ug via INTRAVENOUS

## 2021-10-25 MED ORDER — CEFAZOLIN SODIUM-DEXTROSE 2-4 GM/100ML-% IV SOLN
INTRAVENOUS | Status: AC
  Filled 2021-10-25: qty 100

## 2021-10-25 MED ORDER — LIDOCAINE HCL (PF) 1 % IJ SOLN
INTRAMUSCULAR | Status: DC | PRN
  Administered 2021-10-25: 5 mL

## 2021-10-25 SURGICAL SUPPLY — 51 items
ADH SKN CLS APL DERMABOND .7 (GAUZE/BANDAGES/DRESSINGS) ×1
APL PRP STRL LF DISP 70% ISPRP (MISCELLANEOUS) ×1
APPLIER CLIP 11 MED OPEN (CLIP)
APR CLP MED 11 20 MLT OPN (CLIP)
BLADE SURG 15 STRL LF DISP TIS (BLADE) ×2 IMPLANT
BLADE SURG 15 STRL SS (BLADE) ×2
CHLORAPREP W/TINT 26 (MISCELLANEOUS) ×3 IMPLANT
CLIP APPLIE 11 MED OPEN (CLIP) IMPLANT
CNTNR SPEC 2.5X3XGRAD LEK (MISCELLANEOUS)
CONT SPEC 4OZ STER OR WHT (MISCELLANEOUS)
CONT SPEC 4OZ STRL OR WHT (MISCELLANEOUS)
CONTAINER SPEC 2.5X3XGRAD LEK (MISCELLANEOUS) ×2 IMPLANT
DERMABOND ADVANCED (GAUZE/BANDAGES/DRESSINGS) ×1
DERMABOND ADVANCED .7 DNX12 (GAUZE/BANDAGES/DRESSINGS) ×2 IMPLANT
DEVICE DSSCT PLSMBLD 3.0S LGHT (MISCELLANEOUS) ×2 IMPLANT
DEVICE DUBIN SPECIMEN MAMMOGRA (MISCELLANEOUS) ×3 IMPLANT
DRAPE LAPAROTOMY TRNSV 106X77 (MISCELLANEOUS) ×3 IMPLANT
ELECT CAUTERY BLADE TIP 2.5 (TIP) ×2
ELECT REM PT RETURN 9FT ADLT (ELECTROSURGICAL) ×2
ELECTRODE CAUTERY BLDE TIP 2.5 (TIP) ×2 IMPLANT
ELECTRODE REM PT RTRN 9FT ADLT (ELECTROSURGICAL) ×2 IMPLANT
GAUZE 4X4 16PLY ~~LOC~~+RFID DBL (SPONGE) ×3 IMPLANT
GLOVE SURG SYN 6.5 ES PF (GLOVE) ×6 IMPLANT
GLOVE SURG SYN 6.5 PF PI (GLOVE) ×2 IMPLANT
GLOVE SURG UNDER POLY LF SZ7 (GLOVE) ×5 IMPLANT
GOWN STRL REUS W/ TWL LRG LVL3 (GOWN DISPOSABLE) ×6 IMPLANT
GOWN STRL REUS W/TWL LRG LVL3 (GOWN DISPOSABLE) ×6
JACKSON PRATT 10 (INSTRUMENTS) IMPLANT
KIT MARKER MARGIN INK (KITS) ×1 IMPLANT
KIT TURNOVER KIT A (KITS) ×3 IMPLANT
LABEL OR SOLS (LABEL) ×3 IMPLANT
LIGHT WAVEGUIDE WIDE FLAT (MISCELLANEOUS) IMPLANT
MANIFOLD NEPTUNE II (INSTRUMENTS) ×3 IMPLANT
MARKER MARGIN CORRECT CLIP (MARKER) ×3 IMPLANT
NEEDLE HYPO 22GX1.5 SAFETY (NEEDLE) ×6 IMPLANT
PACK BASIN MINOR ARMC (MISCELLANEOUS) ×3 IMPLANT
PLASMABLADE 3.0S W/LIGHT (MISCELLANEOUS) ×2
SET LOCALIZER 20 PROBE US (MISCELLANEOUS) ×3 IMPLANT
SUT MNCRL 4-0 (SUTURE) ×4
SUT MNCRL 4-0 27XMFL (SUTURE) ×2
SUT SILK 2 0 (SUTURE)
SUT SILK 2-0 30XBRD TIE 12 (SUTURE) IMPLANT
SUT SILK 3 0 12 30 (SUTURE) IMPLANT
SUT VIC AB 3-0 SH 27 (SUTURE) ×4
SUT VIC AB 3-0 SH 27X BRD (SUTURE) ×4 IMPLANT
SUTURE MNCRL 4-0 27XMF (SUTURE) ×4 IMPLANT
SYR 20ML LL LF (SYRINGE) ×3 IMPLANT
TRAP NEPTUNE SPECIMEN COLLECT (MISCELLANEOUS) ×3 IMPLANT
TUBING CONNECTING 10 (TUBING) ×3 IMPLANT
WATER STERILE IRR 1000ML POUR (IV SOLUTION) ×3 IMPLANT
WATER STERILE IRR 500ML POUR (IV SOLUTION) ×2 IMPLANT

## 2021-10-25 NOTE — Discharge Instructions (Addendum)
Removal, Care After This sheet gives you information about how to care for yourself after your procedure. Your health care provider may also give you more specific instructions. If you have problems or questions, contact your health care provider. What can I expect after the procedure? After the procedure, it is common to have: Soreness. Bruising. Itching. Follow these instructions at home: site care Follow instructions from your health care provider about how to take care of your site. Make sure you: Wash your hands with soap and water before and after you change your bandage (dressing). If soap and water are not available, use hand sanitizer. Leave stitches (sutures), skin glue, or adhesive strips in place. These skin closures may need to stay in place for 2 weeks or longer. If adhesive strip edges start to loosen and curl up, you may trim the loose edges. Do not remove adhesive strips completely unless your health care provider tells you to do that. If the area bleeds or bruises, apply gentle pressure for 10 minutes. OK TO SHOWER IN 24HRS  Check your site every day for signs of infection. Check for: Redness, swelling, or pain. Fluid or blood. Warmth. Pus or a bad smell.  General instructions RESUME ASPIRIN IN 48HRS Rest and then return to your normal activities as told by your health care provider.  tylenol and advil as needed for discomfort.  Please alternate between the two every four hours as needed for pain.    Use narcotics, if prescribed, only when tylenol and motrin is not enough to control pain.  325-650mg  every 8hrs to max of 3000mg /24hrs (including the 325mg  in every norco dose) for the tylenol.    Advil up to 800mg  per dose every 8hrs as needed for pain.   Keep all follow-up visits as told by your health care provider. This is important. Contact a health care provider if: You have redness, swelling, or pain around your site. You have fluid or blood coming from your  site. Your site feels warm to the touch. You have pus or a bad smell coming from your site. You have a fever. Your sutures, skin glue, or adhesive strips loosen or come off sooner than expected. Get help right away if: You have bleeding that does not stop with pressure or a dressing. Summary After the procedure, it is common to have some soreness, bruising, and itching at the site. Follow instructions from your health care provider about how to take care of your site. Check your site every day for signs of infection. Contact a health care provider if you have redness, swelling, or pain around your site, or your site feels warm to the touch. Keep all follow-up visits as told by your health care provider. This is important. This information is not intended to replace advice given to you by your health care provider. Make sure you discuss any questions you have with your health care provider. Document Released: 10/13/2015 Document Revised: 03/16/2018 Document Reviewed: 03/16/2018 Elsevier Interactive Patient Education  2019 [employer]  AMBULATORY SURGERY  DISCHARGE INSTRUCTIONS   The drugs that you were given will stay in your system until tomorrow so for the next 24 hours you should not:  Drive an automobile Make any legal decisions Drink any alcoholic beverage   You may resume regular meals tomorrow.  Today it is better to start with liquids and gradually work up to solid foods.  You may eat anything you prefer, but it is better to start with liquids, then soup  and crackers, and gradually work up to solid foods.   Please notify your doctor immediately if you have any unusual bleeding, trouble breathing, redness and pain at the surgery site, drainage, fever, or pain not relieved by medication.    Additional Instructions:        Please contact your physician with any problems or Same Day Surgery at [phone], Monday through Friday 6 am to 4 pm, or [place] at  [facility] number at [phone].

## 2021-10-25 NOTE — H&P (Signed)
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 female who was referred by Cristy Folks, CNM for evaluation of above. Change was noted on last screening mammogram. Patient does routinely do self breast exams. Patient has not noted a change on breast exam. Age of menarche was 39. Age of menopause was 56. Patient denies hormonal therapy. Patient is G0P0.Patient denies nipple discharge. Patient denies previous breast biopsy. Patient denies a personal history of breast cancer.  Past Medical History: has a past medical history of Contact dermatitis, GERD (gastroesophageal reflux disease), H/O myringotomy, Obesity, Pure hypercholesterolemia, and Seasonal allergic rhinitis.  Past Surgical History: has a past surgical history that includes Appendectomy and Tonsillectomy.  Family History: family history includes Other in her mother and paternal aunt.  Social History: reports that she has never smoked. She has never used smokeless tobacco. She reports current alcohol use. She reports that she does not use drugs.  Current Medications: has a current medication list which includes the following prescription(s): aspirin/acetaminophen/caffeine, fluticasone propionate, ipratropium, multivitamin, and norethindrone-ethinyl estradiol.  Allergies:  Allergies as of 09/17/2021   (No Known Allergies)   ROS:  A 15 point review of systems was performed and was negative except as noted in HPI  Objective:    BP 103/70   Pulse 84   Ht 160 cm (5\' 3" )   Wt 73.9 kg (163 lb)   BMI 28.87 kg/m   Constitutional : No distress, cooperative, alert  Lymphatics/Throat: Supple with no lymphadenopathy  Respiratory: Clear to auscultation bilaterally  Cardiovascular: Regular rate and rhythm  Gastrointestinal: Soft, non-tender, non-distended, no organomegaly.  Musculoskeletal: Steady gait and movement  Skin: Cool and moist, no surgical scars  Psychiatric: Normal affect,  non-agitated, not confused  Breast: Normal appearance and no palpable pathology bilaterally. Chaperone present for exam    LABS:  SURGICAL PATHOLOGY SURGICAL PATHOLOGY  CASE: [id number]  PATIENT: Sandra York  Surgical Pathology Report   Specimen Submitted:  A. Breast, left   Clinical History: Indeterminate calcifications.  [place] vs fibroadenoma vs  adenosis vs DCIS. X-shaped clip placed following stereotactic biopsy of  LEFT breast, upper outer quadrant.   DIAGNOSIS:  A. BREAST, LEFT UPPER OUTER QUADRANT; STEREOTACTIC CORE NEEDLE BIOPSY:  - ATYPICAL DUCTAL HYPERPLASIA.  - BACKGROUND FIBROCYSTIC CHANGES AND COLUMNAR CELL CHANGE WITHOUT  ATYPIA, WITH ASSOCIATED MICROCALCIFICATIONS.  - NEGATIVE FOR DUCTAL CARCINOMA IN SITU AND MALIGNANCY.   Comment:  Atypical ductal hyperplasia is present in 2 of 6 tissue blocks, spanning  up to 2 mm in greatest linear extent.   GROSS DESCRIPTION:  A. Labeled: Left breast stereo biopsy upper outer calcs  Received: in a formalin-filled [place] collection device  Specimen radiograph image(s) available for review  Time/Date in fixative: Collected at 9:04 AM on 09/05/2021 and placed in  formalin at 9:07 AM on 09/05/2021  Cold ischemic time: Approximately 3 minutes  Total fixation time: Approximately 8.2 hours  Core pieces: Multiple  Measurement: Aggregate, 3.4 x 1.5 x 0.7 cm  Description / comments: Received are yellow, fibrofatty fragments of  tissue.  A diagram is provided on the specimen container and sections B  and D are checked.  Inked: Green  Entirely submitted in cassette(s):   1-section B  2-section D  3-sections A and C  4-sections E and F  5-sections G and H  6-sections I and J   [facility] 09/05/2021   Final Diagnosis performed by Allena Napoleon, MD.   Electronically signed  09/06/2021 9:48:45AM  The electronic signature indicates that the named Attending Pathologist  has evaluated the specimen  Technical component performed at  [facility], [address] [place],  [place] [address] Lab: [phone] Dir: Rush Farmer, MD, MMM   Professional component performed at [facility], [facility], [place], [place], [place] [address] Lab: [phone]  Dir: Kathi Simpers, MD   RADS: CLINICAL DATA:  Patient was recalled from screening mammogram for  left breast calcifications.   EXAM:  DIGITAL DIAGNOSTIC UNILATERAL LEFT MAMMOGRAM WITH TOMOSYNTHESIS AND  CAD   TECHNIQUE:  Left digital diagnostic mammography and breast tomosynthesis was  performed. The images were evaluated with computer-aided detection.   COMPARISON:  Previous exam(s).   ACR Breast Density Category c: The breast tissue is heterogeneously  dense, which may obscure small masses.   FINDINGS:  Additional imaging of the left breast shows persistence of  developing grouped calcifications spanning an area of 3 mm in the  lateral aspect the breast best seen on the cc view. There is no  suspicious mass.   IMPRESSION:  Indeterminate calcifications in the lateral aspect of the left  breast.   RECOMMENDATION:  Stereotactic biopsy of the calcifications in the lateral aspect of  the left breast is recommended.   I have discussed the findings and recommendations with the patient.  If applicable, a reminder letter will be sent to the patient  regarding the next appointment.   BI-RADS CATEGORY  4: Suspicious.   ADDENDUM:  Pathology revealed ATYPICAL DUCTAL HYPERPLASIA - BACKGROUND  FIBROCYSTIC CHANGES AND COLUMNAR CELL CHANGE WITHOUT   ATYPIA, WITH ASSOCIATED MICROCALCIFICATIONS of the LEFT breast,  upper outer quadrant (X clip). This was found to be concordant by  Dr. Franki Cabot, with surgical consultation for excision  recommended.   Pathology results were discussed with the patient by telephone. The  patient reported doing well after the biopsy with tenderness at the  site. Post biopsy instructions and care were reviewed  and questions  were answered. The patient was encouraged to call [facility] for any additional  concerns.   Reports and recommendations sent via Epic messaging to Al Pimple  RN, and Tanya Nones RN, Oncology Nurse Navigators at [facility] on 09/06/2021 for surgical referral.   Pathology results reported by Stacie Acres RN on 09/06/2021.    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 The risks of general anesthetic, if used, includes MI, CVA, sudden death or even reaction to anesthetic medications also discussed.  Typical post-op recovery time and possbility of activity restrictions were also discussed. Alternatives include continued observation. Benefits include possible symptom relief, pathologic evaluation, and/or curative excision.   The patient verbalized understanding and all questions were answered to the patient's satisfaction.  2. Patient has elected to proceed with surgical treatment. Procedure will be scheduled. Lumpectomy with RF tag placement. LEFT

## 2021-10-25 NOTE — Anesthesia Procedure Notes (Signed)
Procedure Name: Intubation Date/Time: 10/25/2021 9:15 AM Performed by: Kelton Pillar, CRNA Pre-anesthesia Checklist: Patient identified, Emergency Drugs available, Suction available and Patient being monitored Patient Re-evaluated:Patient Re-evaluated prior to induction Oxygen Delivery Method: Circle system utilized Preoxygenation: Pre-oxygenation with 100% oxygen Induction Type: IV induction Ventilation: Mask ventilation without difficulty Laryngoscope Size: McGraph and 3 Grade View: Grade I Tube type: Oral Tube size: 6.5 mm Number of attempts: 1 Airway Equipment and Method: Stylet and Oral airway Placement Confirmation: ETT inserted through vocal cords under direct vision, positive ETCO2, breath sounds checked- equal and bilateral and CO2 detector Secured at: 21 cm Tube secured with: Tape Dental Injury: Teeth and Oropharynx as per pre-operative assessment

## 2021-10-25 NOTE — Op Note (Signed)
Preoperative diagnosis:  LEFT breast ADH.  Postoperative diagnosis: SAME.   Procedure: RF tag-localized left breast lumpectomy  Anesthesia: GETA  Surgeon: Dr. Sung Amabile  Wound Classification: Clean  Indications: Patient is a 46 y.o. female with a nonpalpable left breast mass noted on mammography with core biopsy demonstrating ADH. requires RF localizer placement.  Specimen: left Breast mass,   Complications: None  Estimated Blood Loss: 10 mL  Findings: 1. Specimen mammography shows marker and RF localizer on specimen 2. Pathology call refers gross examination of margins was negative 3. No other palpable mass   Description of procedure: RF localization was performed by radiology prior to procedure. In the nuclear medicine suite, the subareolar region was injected with Tc-99 sulfur colloid the morning of procedure. Localization studies were reviewed. The patient was taken to the operating room and placed supine on the operating table, and after general anesthesia the left breast and axilla were prepped and draped in the usual sterile fashion. A time-out was completed verifying correct patient, procedure, site, positioning, and implant(s) and/or special equipment prior to beginning this procedure.  By identifying the RF localizer, the probable trajectory and location of the mass was visualized. A skin incision was planned in such a way as to minimize the amount of dissection to reach the mass.  The skin incision was made after infusion of local. Flaps were raised and  Sharp and blunt dissection was then taken down to the mass, taking care to include the entire RF localizer and a margin of grossly normal tissue. The specimen was removed. The specimen was oriented with paint and sent to radiology with the localization studies. Confirmation was received that the entire target lesion had been resected.  Wound irrigated, hemostasis was achieved and the wound closed in layers with   interrupted sutures of 3-0 Vicryl in deep dermal layer and a running subcuticular suture of Monocryl 4-0, then dressed with dermabond. The patient tolerated the procedure well and was taken to the postanesthesia care unit in stable condition. Sponge and instrument count correct at end of procedure.

## 2021-10-25 NOTE — Interval H&P Note (Signed)
History and Physical Interval Note:  10/25/2021 8:58 AM  Sandra York  has presented today for surgery, with the diagnosis of Atypical ductal hyperplasia of breast N60.99.  The various methods of treatment have been discussed with the patient and family. After consideration of risks, benefits and other options for treatment, the patient has consented to  Procedure(s): BREAST LUMPECTOMY WITH RADIO FREQUENCY LOCALIZER (Left) as a surgical intervention.  The patient's history has been reviewed, patient examined, no change in status, stable for surgery.  I have reviewed the patient's chart and labs.  Questions were answered to the patient's satisfaction.     Rocky Rishel Tonna Boehringer

## 2021-10-25 NOTE — Anesthesia Preprocedure Evaluation (Signed)
Anesthesia Evaluation  Patient identified by MRN, date of birth, ID band Patient awake    Reviewed: Allergy & Precautions, H&P , NPO status , Patient's Chart, lab work & pertinent test results, reviewed documented beta blocker date and time   History of Anesthesia Complications Negative for: history of anesthetic complications  Airway Mallampati: I  TM Distance: >3 FB Neck ROM: full    Dental  (+) Dental Advidsory Given, Teeth Intact   Pulmonary neg pulmonary ROS,    Pulmonary exam normal breath sounds clear to auscultation       Cardiovascular Exercise Tolerance: Good negative cardio ROS Normal cardiovascular exam Rhythm:regular Rate:Normal     Neuro/Psych PSYCHIATRIC DISORDERS Anxiety negative neurological ROS     GI/Hepatic negative GI ROS, Neg liver ROS,   Endo/Other  negative endocrine ROS  Renal/GU negative Renal ROS  negative genitourinary   Musculoskeletal   Abdominal   Peds  Hematology negative hematology ROS (+)   Anesthesia Other Findings Past Medical History: No date: Seasonal allergies   Reproductive/Obstetrics negative OB ROS                             Anesthesia Physical Anesthesia Plan  ASA: 1  Anesthesia Plan: General   Post-op Pain Management:    Induction: Intravenous  PONV Risk Score and Plan: 3 and Ondansetron, Dexamethasone, Midazolam, Treatment may vary due to age or medical condition and Promethazine  Airway Management Planned: Oral ETT  Additional Equipment:   Intra-op Plan:   Post-operative Plan: Extubation in OR  Informed Consent: I have reviewed the patients History and Physical, chart, labs and discussed the procedure including the risks, benefits and alternatives for the proposed anesthesia with the patient or authorized representative who has indicated his/her understanding and acceptance.     Dental Advisory Given  Plan Discussed  with: Anesthesiologist, CRNA and Surgeon  Anesthesia Plan Comments:         Anesthesia Quick Evaluation

## 2021-10-25 NOTE — Transfer of Care (Signed)
Immediate Anesthesia Transfer of Care Note  Patient: Sandra York  Procedure(s) Performed: BREAST LUMPECTOMY WITH RADIO FREQUENCY LOCALIZER (Left: Breast)  Patient Location: PACU  Anesthesia Type:General  Level of Consciousness: awake, drowsy and patient cooperative  Airway & Oxygen Therapy: Patient Spontanous Breathing and Patient connected to face mask oxygen  Post-op Assessment: Report given to RN and Post -op Vital signs reviewed and stable  Post vital signs: Reviewed and stable  Last Vitals:  Vitals Value Taken Time  BP 107/62 10/25/21 1022  Temp 36.5 C 10/25/21 1022  Pulse 95 10/25/21 1023  Resp 17 10/25/21 1023  SpO2 99 % 10/25/21 1023  Vitals shown include unvalidated device data.  Last Pain:  Vitals:   10/25/21 1022  TempSrc:   PainSc: 0-No pain      Patients Stated Pain Goal: 0 ([id number])  Complications: No notable events documented.

## 2021-10-25 NOTE — Anesthesia Postprocedure Evaluation (Signed)
Anesthesia Post Note  Patient: Sandra York  Procedure(s) Performed: BREAST LUMPECTOMY WITH RADIO FREQUENCY LOCALIZER (Left: Breast)  Patient location during evaluation: PACU Anesthesia Type: General Level of consciousness: awake and alert Pain management: pain level controlled Vital Signs Assessment: post-procedure vital signs reviewed and stable Respiratory status: spontaneous breathing, nonlabored ventilation, respiratory function stable and patient connected to nasal cannula oxygen Cardiovascular status: blood pressure returned to baseline and stable Postop Assessment: no apparent nausea or vomiting Anesthetic complications: no   No notable events documented.   Last Vitals:  Vitals:   10/25/21 1039 10/25/21 1051  BP: 104/69 113/80  Pulse: 89 66  Resp: 19 14  Temp: 36.5 C (!) 36.2 C  SpO2: 98% 100%    Last Pain:  Vitals:   10/25/21 1051  TempSrc: Temporal  PainSc: 0-No pain                 Lenard Simmer

## 2021-10-26 LAB — SURGICAL PATHOLOGY

## 2022-04-23 ENCOUNTER — Other Ambulatory Visit: Payer: Self-pay | Admitting: Surgery

## 2022-04-23 DIAGNOSIS — Z1231 Encounter for screening mammogram for malignant neoplasm of breast: Secondary | ICD-10-CM

## 2022-06-24 ENCOUNTER — Ambulatory Visit
Admission: RE | Admit: 2022-06-24 | Discharge: 2022-06-24 | Disposition: A | Discharge status: Home / Self Care | Payer: BC Managed Care – PPO | Source: Ambulatory Visit | Attending: Surgery | Admitting: Surgery

## 2022-06-24 DIAGNOSIS — Z1231 Encounter for screening mammogram for malignant neoplasm of breast: Secondary | ICD-10-CM | POA: Insufficient documentation

## 2023-05-29 ENCOUNTER — Encounter: Payer: Self-pay | Admitting: Certified Nurse Midwife

## 2023-05-30 ENCOUNTER — Other Ambulatory Visit: Payer: Self-pay | Admitting: Certified Nurse Midwife

## 2023-05-30 DIAGNOSIS — Z1231 Encounter for screening mammogram for malignant neoplasm of breast: Secondary | ICD-10-CM

## 2023-06-26 ENCOUNTER — Ambulatory Visit
Admission: RE | Admit: 2023-06-26 | Discharge: 2023-06-26 | Disposition: A | Discharge status: Home / Self Care | Payer: BC Managed Care – PPO | Source: Ambulatory Visit | Attending: Certified Nurse Midwife | Admitting: Certified Nurse Midwife

## 2023-06-26 DIAGNOSIS — Z1231 Encounter for screening mammogram for malignant neoplasm of breast: Secondary | ICD-10-CM | POA: Diagnosis present

## 2024-01-14 IMAGING — MG MM PLC BREAST LOC DEV 1ST LESION INC MAMMO GUIDE*L*
8 of 9 series · 8 of 13 positions shown · non-contrast
Comparison: Previous exam(s)

CLINICAL DATA: 45-year-old female with biopsy proven left blast
atypia.

EXAM:
MAMMOGRAPHIC GUIDED RADIOFREQUENCY DEVICE
LOCALIZATION OF THE LEFT BREAST

[L CC (1 of 4)]
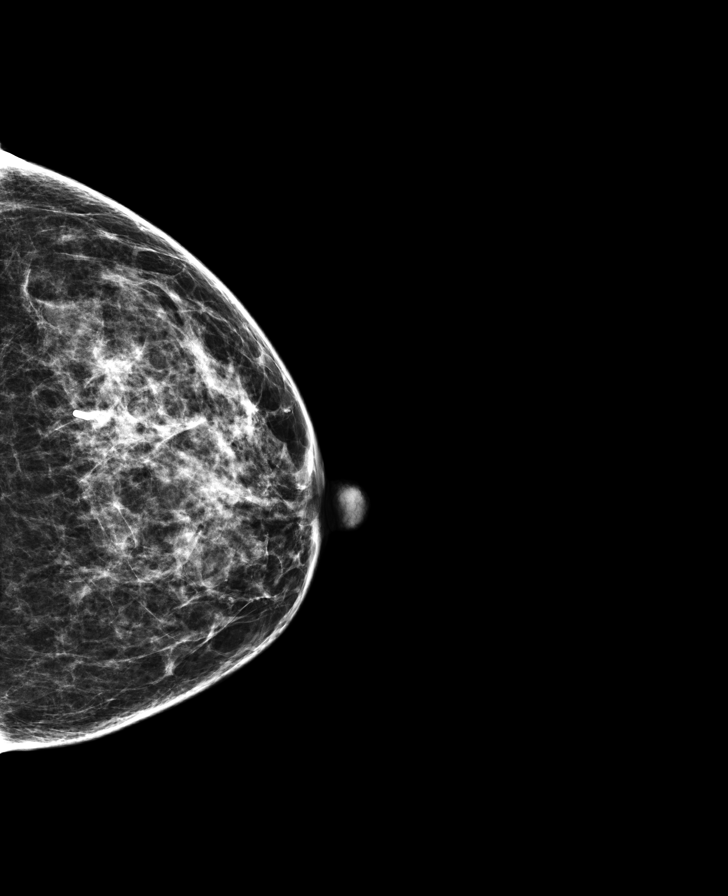

[L ML (1 of 3)]
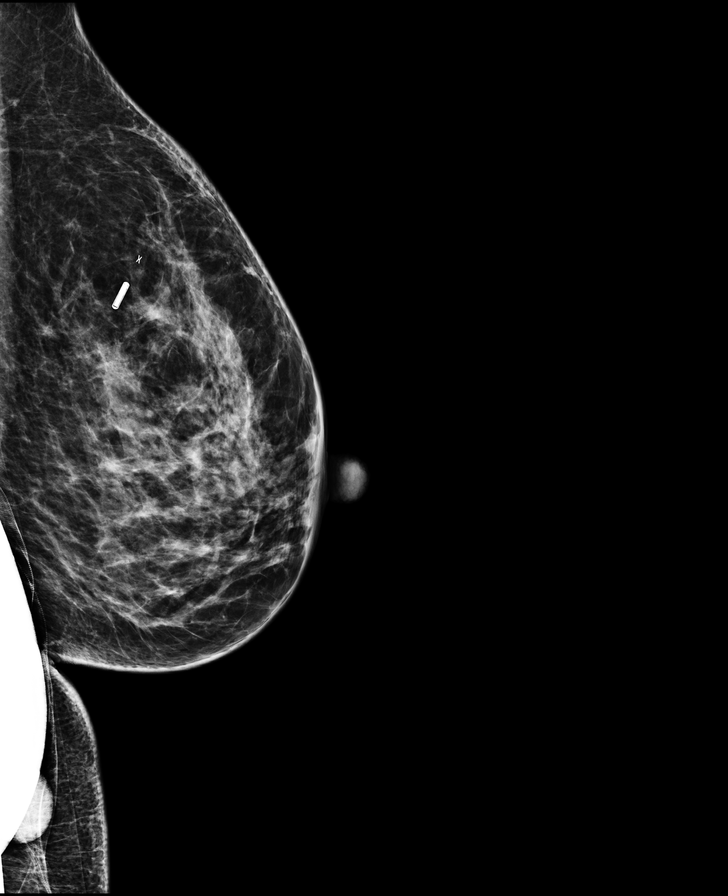

[L ML (2 of 3)]
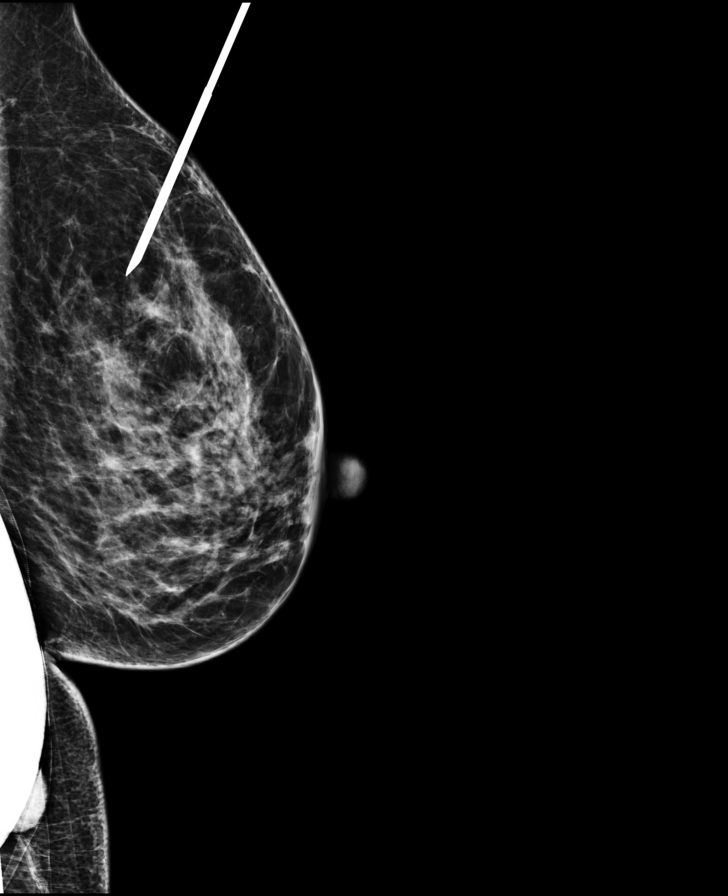

[L CC (2 of 4)]
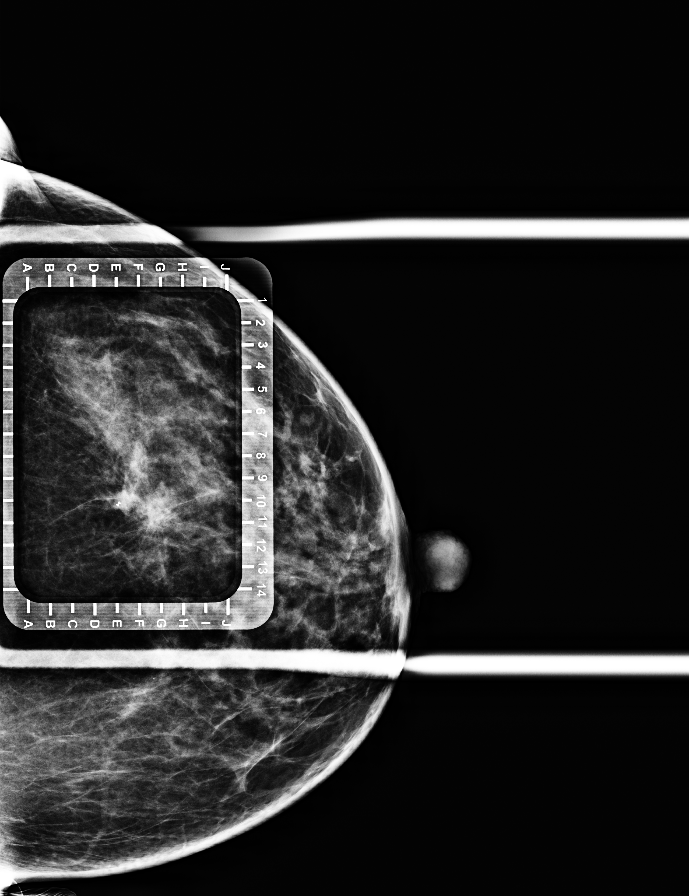

[L CC (3 of 4)]
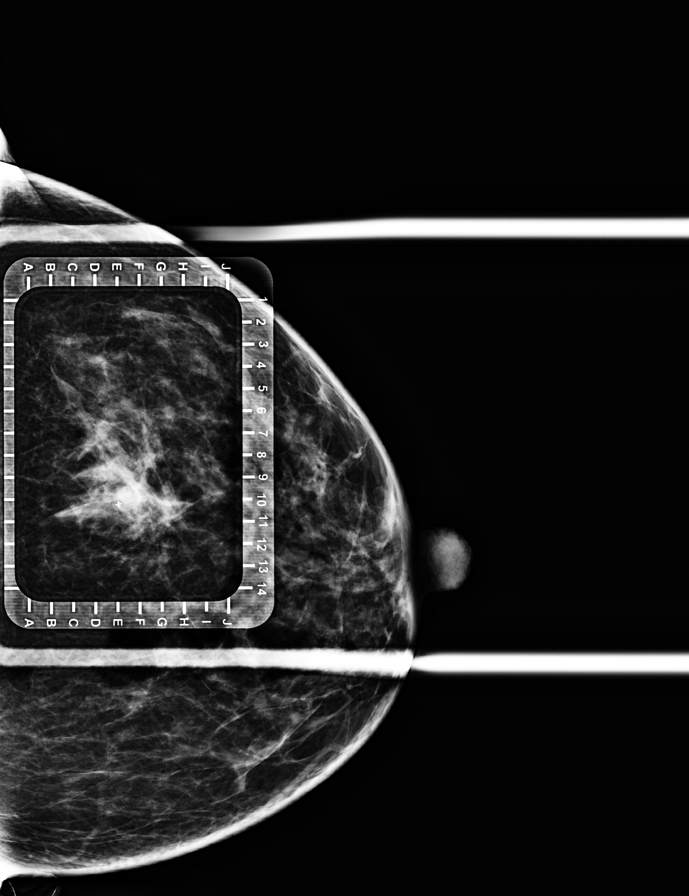

[L ML (3 of 3)]
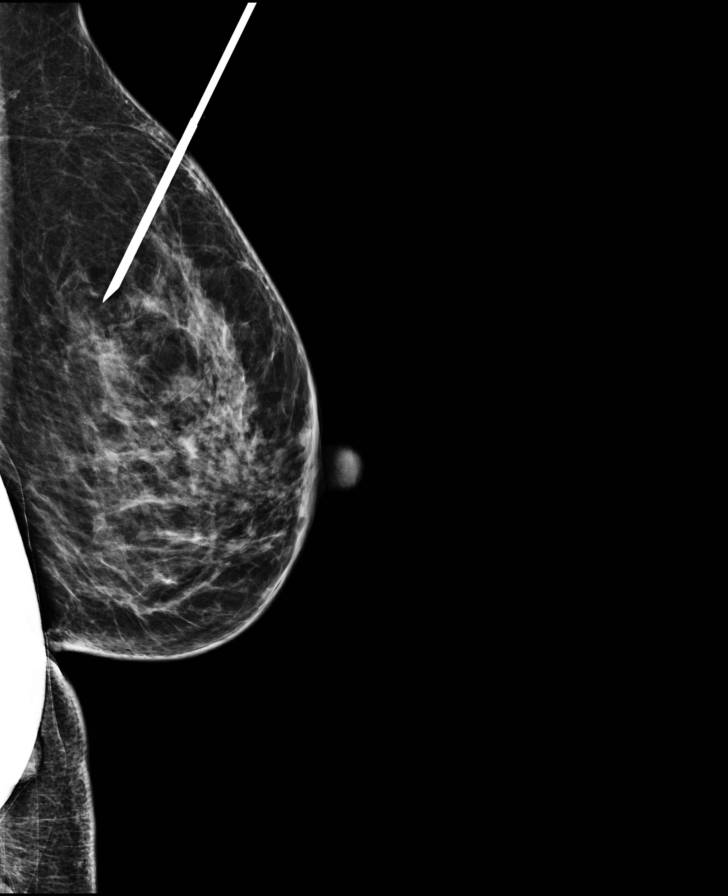

[L CC (4 of 4)]
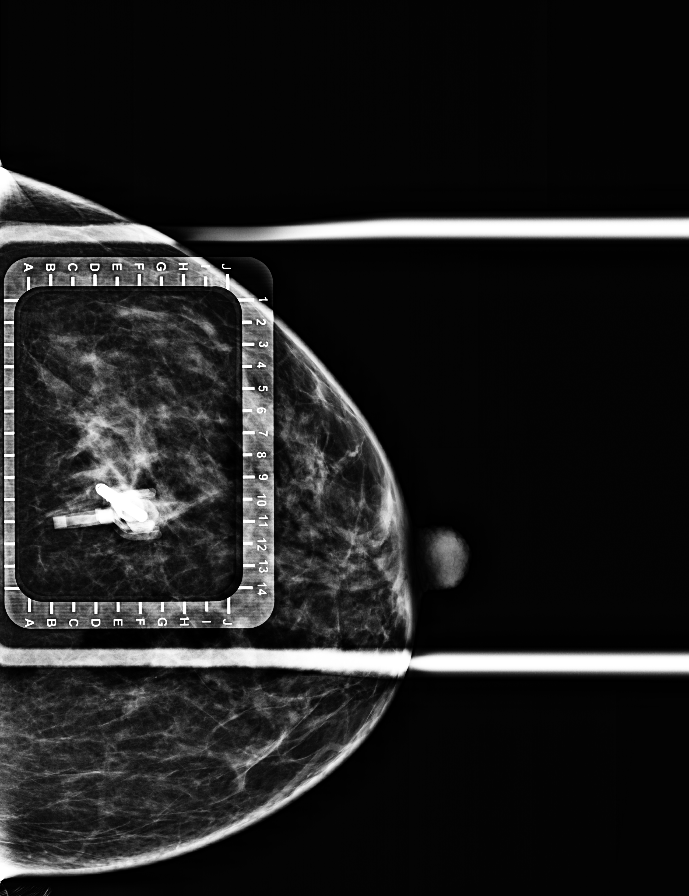

[L ML synth-2D]
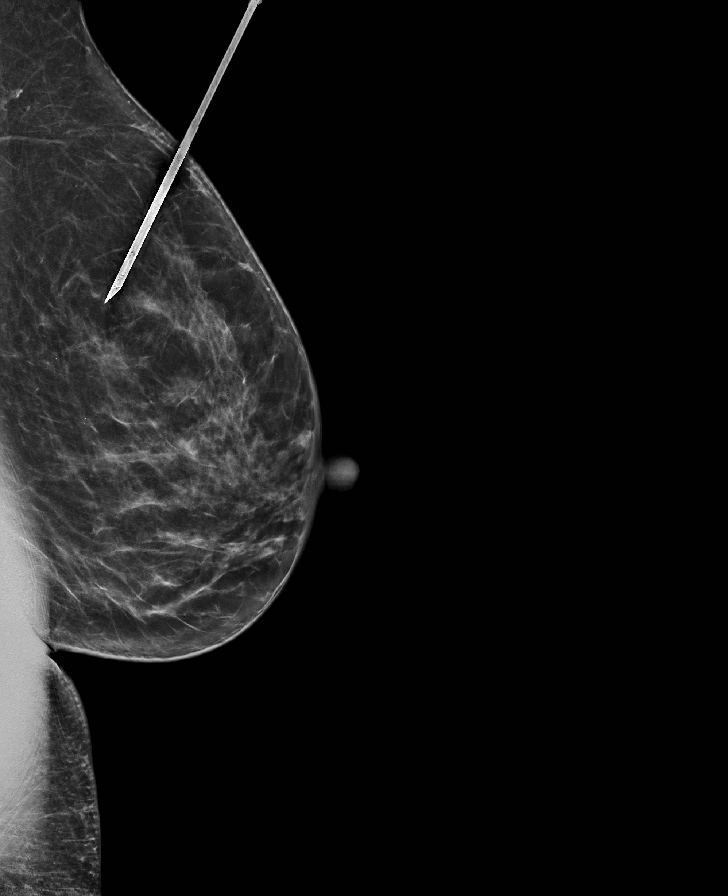

[8 of 13 positions shown; findings below may reference images not displayed]

FINDINGS: Patient presents for radiofrequency device localization prior to
left breast surgical excision. I met with the patient and we
discussed the procedure of radiofrequency device localization
including benefits and alternatives. We discussed the high
likelihood of a successful procedure. We discussed the risks of the
procedure including infection, bleeding, tissue injury and further
surgery. Informed, written consent was given.

The usual time-out protocol was performed immediately prior to the
procedure.

Using mammographic guidance, sterile technique, 1% lidocaine as
local anesthesia, a radiofrequency tag was used to localize an X
shaped clip using a superior approach. The tag deployed
approximately 5 mm deep to the clip in the craniocaudal plane.

The follow-up mammogram images confirm positioning of the RF device
and are marked for Dr. Roy-Lil.

Follow-up survey of the patient confirms the presence of the RF
device.

The patient tolerated the procedure well and was released from the
[REDACTED].
IMPRESSION: Radiofrequency device localization of the LEFT breast. No apparent
complications.

## 2024-01-22 IMAGING — MG MM BREAST SURGICAL SPECIMEN
1 series · 1 of 1 positions shown · non-contrast
Comparison: Previous exam(s).

CLINICAL DATA: Status post RF ID tag localized LEFT breast
lumpectomy.

EXAM:
SPECIMEN RADIOGRAPH OF THE LEFT BREAST

[L]
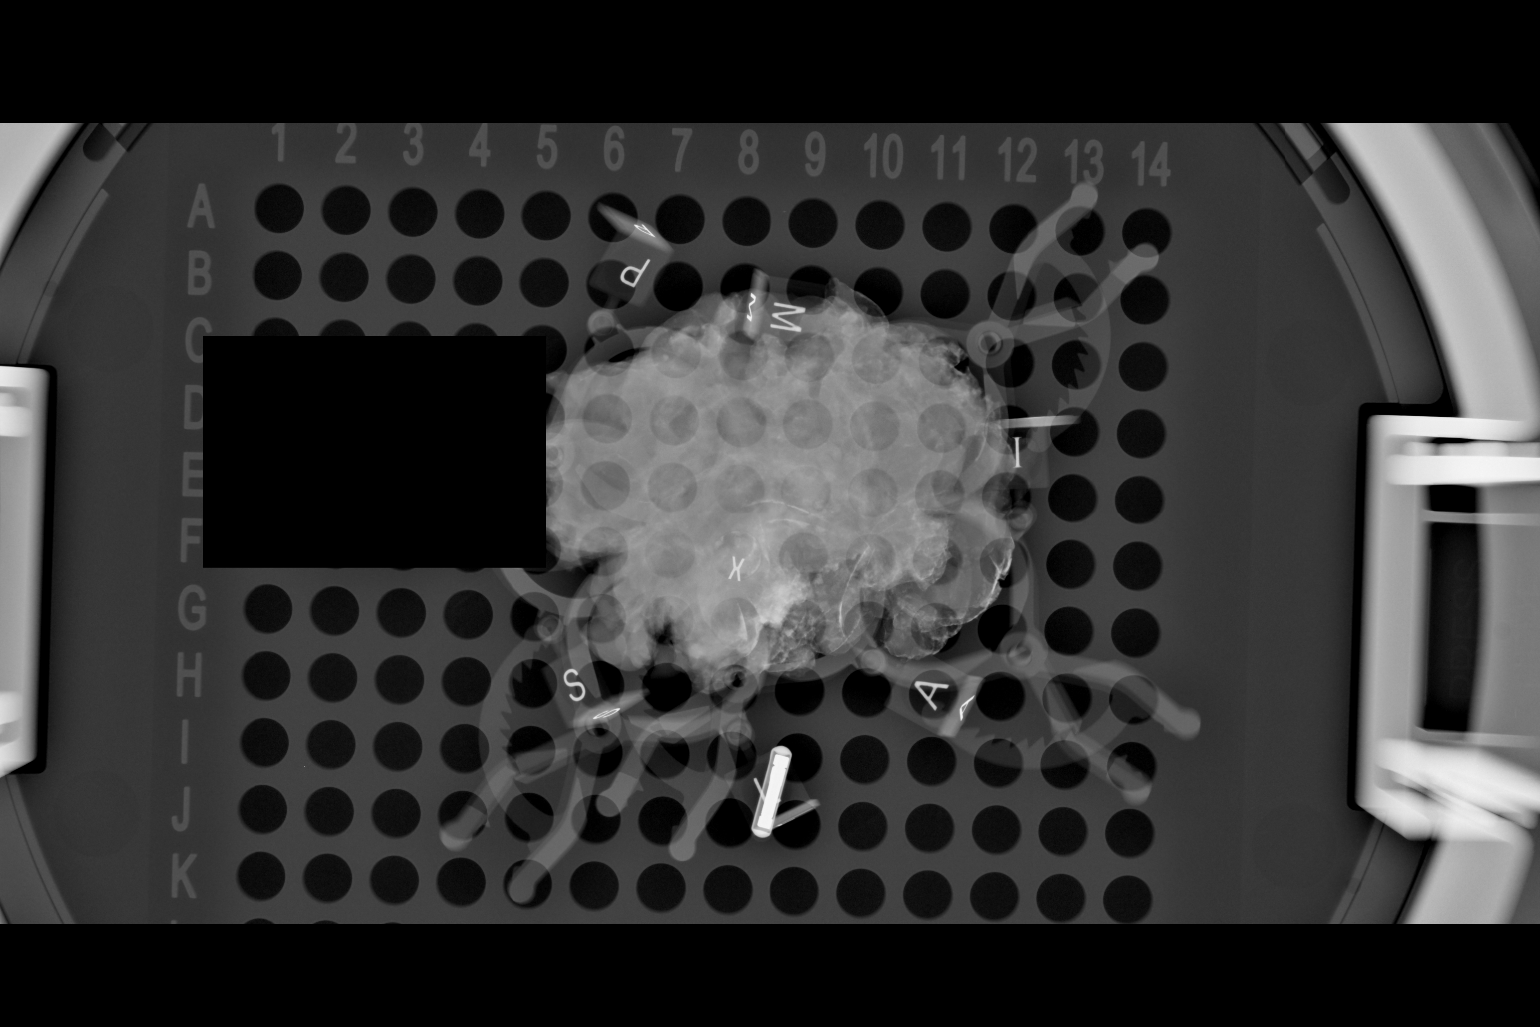

[1 of 1 positions shown; findings below may reference images not displayed]

FINDINGS: Status post excision of the LEFT breast. The RF ID tag and X shaped
clip are present within the specimen.
IMPRESSION: Specimen radiograph of the LEFT breast.

These results were called by telephone at the time of interpretation
on 10/25/2021 at [DATE] to provider TRU FREEDMAN , who verbally
acknowledged these results.

## 2024-03-02 ENCOUNTER — Ambulatory Visit: Payer: Self-pay | Admitting: Podiatry

## 2024-03-02 DIAGNOSIS — M21962 Unspecified acquired deformity of left lower leg: Secondary | ICD-10-CM | POA: Diagnosis not present

## 2024-03-02 DIAGNOSIS — M21961 Unspecified acquired deformity of right lower leg: Secondary | ICD-10-CM

## 2024-03-02 NOTE — Progress Notes (Signed)
 Subjective:  Patient ID: Sandra York, female    DOB: October 12, 1975,  MRN: [id number]  Chief Complaint  Patient presents with   Foot Pain    Pt stated that she has right hip pain and she wanted to have her feet checked to make sure they are not the cause of the hip pain    48 y.o. female presents with the above complaint.  Patient presents with flatfoot deformity bilaterally.  Patient states is causing some right hip issues.  She wanted to get it checked out.  She denies seeing anyone else prior to seeing me.  She does not seem to have any acute foot and ankle issues however when she is ambulating she is having hip issues likely due to feet not being properly supported.  Denies any other acute issues.  Alignment of the hip is good.   Review of Systems: Negative except as noted in the HPI. Denies N/V/F/Ch.  Past Medical History:  Diagnosis Date   Seasonal allergies     Current Outpatient Medications:    aspirin-acetaminophen -caffeine (EXCEDRIN MIGRAINE) 250-250-65 MG tablet, Take 3 tablets by mouth every 6 (six) hours as needed for headache., Disp: , Rfl:    fluticasone (FLONASE) 50 MCG/ACT nasal spray, Place 1 spray into the nose at bedtime., Disp: , Rfl:    HYDROcodone -acetaminophen  (NORCO) 5-325 MG tablet, Take 1 tablet by mouth every 6 (six) hours as needed for up to 6 doses for moderate pain., Disp: 6 tablet, Rfl: 0   ibuprofen  (ADVIL ) 800 MG tablet, Take 1 tablet (800 mg total) by mouth every 8 (eight) hours as needed for mild pain or moderate pain., Disp: 30 tablet, Rfl: 0   Multiple Vitamin (MULTIVITAMIN) capsule, Take 1 capsule by mouth daily., Disp: , Rfl:    norethindrone-ethinyl estradiol  (LOESTRIN) 1-20 MG-MCG tablet, Take by mouth., Disp: , Rfl:    OVER THE COUNTER MEDICATION, Take 2 tablets by mouth in the morning and at bedtime. Neuro Balance, Disp: , Rfl:   Social History   Tobacco Use  Smoking Status Never  Smokeless Tobacco Never    Allergies  Allergen  Reactions   Lactose Intolerance (Gi)     Bloating    Objective:  There were no vitals filed for this visit. There is no height or weight on file to calculate BMI. Constitutional Well developed. Well nourished.  Vascular Dorsalis pedis pulses palpable bilaterally. Posterior tibial pulses palpable bilaterally. Capillary refill normal to all digits.  No cyanosis or clubbing noted. Pedal hair growth normal.  Neurologic Normal speech. Oriented to person, place, and time. Epicritic sensation to light touch grossly present bilaterally.  Dermatologic Nails well groomed and normal in appearance. No open wounds. No skin lesions.  Orthopedic: Gait examination shows pes planovalgus with calcaneovalgus to many toe signs partially but recurred there for dorsiflexion of the hallux able to perform single double heel raise with return of calcaneus to neutral position.   Radiographs: None Assessment:  No diagnosis found. Plan:  Patient was evaluated and treated and all questions answered.  Pes planovalgus/foot deformity leading to right hip pain -I explained to patient the etiology of pes planovalgus and relationship with hip pain and various treatment options were discussed.  Given patient foot structure in the setting of hip pain and kinetic chain believe patient will benefit from custom-made orthotics to help control the hindfoot motion support the arch of the foot and take the stress away from arches.  Patient agrees with the plan like to  proceed with orthotics -Patient was casted for orthotics

## 2024-03-04 NOTE — Progress Notes (Signed)
 Orthotic order placed will call for fitting when in  Britton Cane Cped, CFo, CFm

## 2024-03-17 ENCOUNTER — Telehealth: Payer: Self-pay

## 2024-03-17 NOTE — Telephone Encounter (Signed)
 LVM to schedule orthotic PU

## 2024-04-13 ENCOUNTER — Telehealth: Payer: Self-pay

## 2024-04-13 NOTE — Telephone Encounter (Signed)
 Orthotics in [place] bin

## 2024-04-27 ENCOUNTER — Ambulatory Visit (INDEPENDENT_AMBULATORY_CARE_PROVIDER_SITE_OTHER): Admitting: Podiatry

## 2024-04-27 DIAGNOSIS — M21961 Unspecified acquired deformity of right lower leg: Secondary | ICD-10-CM

## 2024-04-27 DIAGNOSIS — M21962 Unspecified acquired deformity of left lower leg: Secondary | ICD-10-CM

## 2024-04-27 NOTE — Progress Notes (Signed)
 Orthotics were dispensed they are functioning well.  No acute complaints.  Break-in period was discussed

## 2024-05-12 ENCOUNTER — Other Ambulatory Visit: Payer: Self-pay | Admitting: Gerontology

## 2024-05-12 DIAGNOSIS — Z1231 Encounter for screening mammogram for malignant neoplasm of breast: Secondary | ICD-10-CM

## 2024-07-06 ENCOUNTER — Ambulatory Visit
Admission: RE | Admit: 2024-07-06 | Discharge: 2024-07-06 | Disposition: A | Discharge status: Home / Self Care | Payer: Self-pay | Source: Ambulatory Visit | Attending: Gerontology | Admitting: Gerontology

## 2024-07-06 DIAGNOSIS — Z1231 Encounter for screening mammogram for malignant neoplasm of breast: Secondary | ICD-10-CM | POA: Insufficient documentation

## 2024-08-13 ENCOUNTER — Encounter: Payer: Self-pay | Admitting: Emergency Medicine

## 2024-08-13 ENCOUNTER — Ambulatory Visit
Admission: EM | Admit: 2024-08-13 | Discharge: 2024-08-13 | Disposition: A | Discharge status: Home / Self Care | Attending: Emergency Medicine | Admitting: Emergency Medicine

## 2024-08-13 DIAGNOSIS — N9489 Other specified conditions associated with female genital organs and menstrual cycle: Secondary | ICD-10-CM | POA: Insufficient documentation

## 2024-08-13 DIAGNOSIS — R300 Dysuria: Secondary | ICD-10-CM | POA: Diagnosis present

## 2024-08-13 DIAGNOSIS — R1030 Lower abdominal pain, unspecified: Secondary | ICD-10-CM

## 2024-08-13 LAB — POCT URINE DIPSTICK
Bilirubin, UA: NEGATIVE
Glucose, UA: NEGATIVE mg/dL
Ketones, POC UA: NEGATIVE mg/dL
Nitrite, UA: NEGATIVE
POC PROTEIN,UA: NEGATIVE
Spec Grav, UA: 1.01 (ref 1.010–1.025)
Urobilinogen, UA: 0.2 U/dL
pH, UA: 7.5 (ref 5.0–8.0)

## 2024-08-13 NOTE — Discharge Instructions (Addendum)
 As we discussed, your urinalysis did not show any direct indication of urinary tract infection.  We will send her urine for culture to see if any bacteria grows out.  Given your cluster symptoms it is totally possible that you have bacterial vaginosis.  The swab that you collected will be sent to the lab and analyzed.  If it comes back positive for bacterial vaginosis we will contact you by phone and treat you with a round of antibiotics.  However, if your vaginal swab is negative for any sign of infection I would recommend that you make an appointment with your GYN for evaluation.  Especially, given that you experience some spotting in the last couple weeks and you have not had a menstrual cycle in the last 2 years.

## 2024-08-13 NOTE — ED Triage Notes (Signed)
 Pt c/o lower abdominal pain. Started today. She states the pain is relieved by urinating. Denies back pain or fevers. She states she has started PT for her hips.

## 2024-08-13 NOTE — ED Provider Notes (Signed)
 [facility]    CSN: [id number] Arrival date & time: 08/13/24  1726      History   Chief Complaint Chief Complaint  Patient presents with   Abdominal Pain    HPI Sandra York is a 48 y.o. female.   HPI  48 year old female with no significant past medical history presents for evaluation of right inguinal pain that started earlier today while she was at school.  The pain is alleviated with urination.  No pain with urination, urgency or frequency, or hematuria.  Also no vaginal discharge or itching.  Past Medical History:  Diagnosis Date   Seasonal allergies     Patient Active Problem List   Diagnosis Date Noted   Bad dreams 12/21/2014   Seasonal allergies     Past Surgical History:  Procedure Laterality Date   APPENDECTOMY  12/30/2007   BREAST BIOPSY Left 09/05/2021   Stereo bx, X-clip,ATYPICAL DUCTAL HYPERPLASIA.   BREAST EXCISIONAL BIOPSY Left 10/25/2021   ADH   BREAST LUMPECTOMY WITH RADIO FREQUENCY LOCALIZER Left 10/25/2021   Procedure: BREAST LUMPECTOMY WITH RADIO FREQUENCY LOCALIZER;  Surgeon: Tye Millet, DO;  Location: [facility] ORS;  Service: General;  Laterality: Left;   TONSILLECTOMY  10/01/1979    OB History     Gravida  0   Para      Term      Preterm      AB      Living         SAB      IAB      Ectopic      Multiple      Live Births               Home Medications    Prior to Admission medications   Medication Sig Start Date End Date Taking? Authorizing Provider  Cholecalciferol (VITAMIN D3) 1.25 MG (50000 UT) CAPS  10/23/23  Yes [provider]  cyanocobalamin (VITAMIN B12) 1000 MCG tablet Take 2 tablets daily for 2 weeks, then reduce to 1 tablet daily thereafter for Vitamin B12 Deficiency. 10/23/23  Yes [provider]  Multiple Vitamin (MULTIVITAMIN) capsule Take 1 capsule by mouth daily.   Yes [provider]  tamoxifen (NOLVADEX) 20 MG tablet Take 20 mg by mouth. 06/17/24  Yes  [provider]  aspirin-acetaminophen -caffeine (EXCEDRIN MIGRAINE) 250-250-65 MG tablet Take 3 tablets by mouth every 6 (six) hours as needed for headache.    [provider]  fluticasone (FLONASE) 50 MCG/ACT nasal spray Place 1 spray into the nose at bedtime.    [provider]  HYDROcodone -acetaminophen  (NORCO) 5-325 MG tablet Take 1 tablet by mouth every 6 (six) hours as needed for up to 6 doses for moderate pain. 10/25/21   Tye, Isami, DO  ibuprofen  (ADVIL ) 800 MG tablet Take 1 tablet (800 mg total) by mouth every 8 (eight) hours as needed for mild pain or moderate pain. 10/25/21   Sakai, Isami, DO  norethindrone-ethinyl estradiol  (LOESTRIN) 1-20 MG-MCG tablet Take by mouth. 03/20/21   [provider]  OVER THE COUNTER MEDICATION Take 2 tablets by mouth in the morning and at bedtime. Neuro Balance    [provider]    Family History Family History  Problem Relation Age of Onset   Hypertension Father    Hypertension Paternal Grandfather    Breast cancer Neg Hx     Social History Social History   Tobacco Use   Smoking status: Never   Smokeless tobacco:  Never  Vaping Use   Vaping status: Never Used  Substance Use Topics   Alcohol use: Yes    Comment: once in a blue moon   Drug use: No     Allergies   Lactose intolerance (gi)   Review of Systems Review of Systems  Gastrointestinal:  Positive for abdominal pain.  Genitourinary:  Negative for dysuria, frequency, hematuria, urgency, vaginal discharge and vaginal pain.     Physical Exam Triage Vital Signs ED Triage Vitals  Encounter Vitals Group     BP      Girls Systolic BP Percentile      Girls Diastolic BP Percentile      Boys Systolic BP Percentile      Boys Diastolic BP Percentile      Pulse      Resp      Temp      Temp src      SpO2      Weight      Height      Head Circumference      Peak Flow      Pain Score      Pain Loc      Pain Education       Exclude from Growth Chart    No data found.  Updated Vital Signs BP 111/79 (BP Location: Right Arm)   Pulse 74   Temp 98.2 F (36.8 C) (Oral)   Resp 16   Ht 5' 3 (1.6 m)   Wt 162 lb 14.7 oz (73.9 kg)   SpO2 99%   BMI 28.86 kg/m   Visual Acuity Right Eye Distance:   Left Eye Distance:   Bilateral Distance:    Right Eye Near:   Left Eye Near:    Bilateral Near:     Physical Exam Vitals and nursing note reviewed.  Constitutional:      Appearance: Normal appearance. She is not ill-appearing.  HENT:     Head: Normocephalic and atraumatic.  Cardiovascular:     Rate and Rhythm: Normal rate and regular rhythm.     Pulses: Normal pulses.     Heart sounds: Normal heart sounds. No murmur heard.    No friction rub. No gallop.  Pulmonary:     Effort: Pulmonary effort is normal.     Breath sounds: Normal breath sounds. No wheezing, rhonchi or rales.  Abdominal:     General: Abdomen is flat.     Palpations: Abdomen is soft.     Tenderness: There is no abdominal tenderness. There is no right CVA tenderness, left CVA tenderness, guarding or rebound.  Skin:    General: Skin is warm and dry.     Capillary Refill: Capillary refill takes less than 2 seconds.     Findings: No erythema or rash.  Neurological:     General: No focal deficit present.     Mental Status: She is alert and oriented to person, place, and time.      UC Treatments / Results  Labs (all labs ordered are listed, but only abnormal results are displayed) Labs Reviewed  POCT URINE DIPSTICK - Abnormal; Notable for the following components:      Result Value   Blood, UA trace-lysed (*)    Leukocytes, UA Trace (*)    All other components within normal limits  URINE CULTURE  CERVICOVAGINAL ANCILLARY ONLY    EKG   Radiology No results found.  Procedures Procedures (including critical care time)  Medications Ordered in UC  Medications - No data to display  Initial Impression / Assessment and Plan /  UC Course  I have reviewed the triage vital signs and the nursing notes.  Pertinent labs & imaging results that were available during my care of the patient were reviewed by me and considered in my medical decision making (see chart for details).   Patient is a pleasant, nontoxic-appearing 48 year old female presenting for evaluation of right inguinal/abdominal pain that started today while she was at school.  She reports that it was a sharp pain and that it was alleviated by urination.  When she got home later in the day the pain returned and she again urinated without any significant improvement.  She went to a friend to help her with some tasks and the pain worsened.  At this point the pain has resolved.  There is no associated urgency, frequency, or dysuria.  Also no hematuria.  She has no CVA tenderness on exam and her abdomen is soft and flat and nontender.  The patient reports that she has not had a menstrual cycle in 2 years and either earlier this week or last week she had an episode of spotting.  She also endorses that she has had a fishy odor emanating from her vaginal vault over the last several weeks.  I will order urinalysis to assess for the presence of UTI as well as a vaginal cytology swab to assess for the presence of BV or yeast.  Patient's urinalysis shows trace lysed RBCs and trace leukocyte esterase but negative for nitrites.  I will send urine for culture.  I will discharge patient with diagnosis of lower abdominal pain and have her use over-the-counter Tylenol  and/or ibuprofen  as needed for pain.  I have advised her that her close her symptoms are may indicate that she has bacterial vaginosis, and since she has lost the estrogen protection being that she is in menopause that does put her at increased risk for UTIs, BV, and genitourinary syndrome of menopause.  I have also advised her that if her cytology swab comes back negative for BV or yeast she needs to make an appointment with  her GYN to be evaluated because vaginal bleeding in the setting of menopause is concerning for possible neoplasm.   Final Clinical Impressions(s) / UC Diagnoses   Final diagnoses:  Dysuria  Lower abdominal pain     Discharge Instructions      As we discussed, your urinalysis did not show any direct indication of urinary tract infection.  We will send her urine for culture to see if any bacteria grows out.  Given your cluster symptoms it is totally possible that you have bacterial vaginosis.  The swab that you collected will be sent to the lab and analyzed.  If it comes back positive for bacterial vaginosis we will contact you by phone and treat you with a round of antibiotics.  However, if your vaginal swab is negative for any sign of infection I would recommend that you make an appointment with your GYN for evaluation.  Especially, given that you experience some spotting in the last couple weeks and you have not had a menstrual cycle in the last 2 years.     ED Prescriptions   None    PDMP not reviewed this encounter.   Bernardino Ditch, NP 08/13/24 1807

## 2024-08-14 LAB — URINE CULTURE
Culture: <10000 — AB
Special Requests: NORMAL

## 2024-08-16 ENCOUNTER — Ambulatory Visit (HOSPITAL_COMMUNITY): Payer: Self-pay

## 2024-08-16 LAB — CERVICOVAGINAL ANCILLARY ONLY
Bacterial Vaginitis (gardnerella): POSITIVE — AB
Candida Glabrata: NEGATIVE
Candida Vaginitis: NEGATIVE
Comment: NEGATIVE
Comment: NEGATIVE
Comment: NEGATIVE

## 2024-08-16 MED ORDER — METRONIDAZOLE 500 MG PO TABS: 500.0000 mg | ORAL_TABLET | Freq: Two times a day (BID) | ORAL | 0 refills | Status: AC
